# Patient Record
Sex: Female | Born: 1960 | Race: Black or African American | Hispanic: No | Marital: Single
Health system: Southern US, Academic
[De-identification: ages and names within clinical notes are randomized; demographics above are authoritative.]

## PROBLEM LIST (undated history)

## (undated) ENCOUNTER — Telehealth

## (undated) ENCOUNTER — Encounter

## (undated) ENCOUNTER — Ambulatory Visit

## (undated) ENCOUNTER — Encounter
Attending: Pharmacist Clinician (PhC)/ Clinical Pharmacy Specialist | Primary: Pharmacist Clinician (PhC)/ Clinical Pharmacy Specialist

## (undated) ENCOUNTER — Ambulatory Visit: Payer: PRIVATE HEALTH INSURANCE

## (undated) ENCOUNTER — Ambulatory Visit: Payer: PRIVATE HEALTH INSURANCE | Attending: Gastroenterology | Primary: Gastroenterology

## (undated) ENCOUNTER — Encounter: Attending: Gastroenterology | Primary: Gastroenterology

## (undated) ENCOUNTER — Ambulatory Visit
Attending: Pharmacist Clinician (PhC)/ Clinical Pharmacy Specialist | Primary: Pharmacist Clinician (PhC)/ Clinical Pharmacy Specialist

## (undated) ENCOUNTER — Ambulatory Visit: Attending: Diagnostic Radiology | Primary: Diagnostic Radiology

## (undated) ENCOUNTER — Encounter: Payer: PRIVATE HEALTH INSURANCE | Attending: Gastroenterology | Primary: Gastroenterology

## (undated) DIAGNOSIS — G709 Myoneural disorder, unspecified: Secondary | ICD-10-CM

## (undated) DIAGNOSIS — B1920 Unspecified viral hepatitis C without hepatic coma: Secondary | ICD-10-CM

## (undated) HISTORY — DX: Myoneural disorder, unspecified: G70.9

## (undated) HISTORY — DX: Unspecified viral hepatitis C without hepatic coma: B19.20

## (undated) HISTORY — PX: ABDOMINAL HYSTERECTOMY: SHX81

---

## 2008-07-09 ENCOUNTER — Encounter: Admission: RE | Admit: 2008-07-09 | Discharge: 2008-07-09 | Payer: Self-pay | Admitting: Family Medicine

## 2008-11-27 ENCOUNTER — Ambulatory Visit: Payer: Self-pay | Admitting: Gastroenterology

## 2011-02-24 ENCOUNTER — Ambulatory Visit (INDEPENDENT_AMBULATORY_CARE_PROVIDER_SITE_OTHER): Payer: Medicaid Other | Admitting: Gastroenterology

## 2011-02-24 ENCOUNTER — Other Ambulatory Visit: Payer: Self-pay | Admitting: Gastroenterology

## 2011-02-24 VITALS — BP 133/88 | HR 81 | Temp 98.9°F | Ht 71.0 in | Wt 212.0 lb

## 2011-02-24 DIAGNOSIS — B182 Chronic viral hepatitis C: Secondary | ICD-10-CM

## 2011-02-24 LAB — CBC WITH DIFFERENTIAL/PLATELET
Basophils Absolute: 0 10*3/uL (ref 0.0–0.1)
Eosinophils Absolute: 0 10*3/uL (ref 0.0–0.7)
Hemoglobin: 14.7 g/dL (ref 12.0–15.0)
Lymphs Abs: 1.3 10*3/uL (ref 0.7–4.0)
MCHC: 33 g/dL (ref 30.0–36.0)
Monocytes Absolute: 0.4 10*3/uL (ref 0.1–1.0)
Neutrophils Relative %: 51 % (ref 43–77)
Platelets: 210 10*3/uL (ref 150–400)
RBC: 5.7 MIL/uL — ABNORMAL HIGH (ref 3.87–5.11)
RDW: 13.8 % (ref 11.5–15.5)

## 2011-02-24 LAB — TSH: TSH: 0.52 u[IU]/mL (ref 0.350–4.500)

## 2011-02-24 LAB — COMPREHENSIVE METABOLIC PANEL
AST: 65 U/L — ABNORMAL HIGH (ref 0–37)
Chloride: 104 mEq/L (ref 96–112)
Total Bilirubin: 0.9 mg/dL (ref 0.3–1.2)

## 2011-02-24 LAB — PROTIME-INR: INR: 0.93 (ref <1.50)

## 2011-02-24 LAB — IRON AND TIBC
%SAT: 36 % (ref 20–55)
Iron: 133 ug/dL (ref 42–145)
TIBC: 372 ug/dL (ref 250–470)

## 2011-02-25 LAB — HEPATITIS A ANTIBODY, TOTAL: Hep A Total Ab: NEGATIVE

## 2011-03-03 NOTE — Progress Notes (Signed)
NAME:  Kathleen Carter, Kathleen Carter    MR#:  [id number]      DATE:  02/24/2011  DOB:  [birth year]    cc: Consulting physician:  Rogers Seeds, MD, [facility], [address], [place], [place], [address], Fax [phone] Primary care physician  None Referring Physician:  Jackie Plum, MD, [facility], [address], [address], [place], [place] [address], Fax [phone]    REASON FOR VISIT:  Followup genotype 1a HCV previously treated.    History:  The patient returns today unaccompanied. It will be recalled that she is a 50 year old woman who is somewhat of a poor historian but from what I gathered from her last appointment on 11/27/2008, she is  genotype 1a hepatitis C. Her IL-28B was not determined. A biopsy on 10/03/2002, at [facility] in [place] showed minimal chronic inflammatory activity and "essentially no  abnormal fibrosis." She was then started on a combination of Pegasys at 180 mcg subcu weekly, and ribavirin 600 mg p.o. b.i.d. sometime between 2004 and 2007. It appears she was treated for 2 months,  whereupon her treatment was stopped possibly because of thrombocytopenia. Her virological response in the 2 months is unknown. When seen by me on 11/27/2008, I suggested another liver biopsy having  learned that she is genotype 1a, thereafter she was to return to review the results of the liver biopsy and discuss treatment. An order for liver biopsy was sent, but she never followed through. There are  no symptoms directly referable to her history of hepatitis C and there are no symptoms to suggest decompensated or cryoglobulin mediated disease.   Past medical history:  Of note, the patient reports that she no longer sees Dr. Julio Sicks as her primary physician. She is relying on urgent care for various complaints as they arise. She complains of a "bumps" on her skin for  which she has been given topical steroids. She  also complains of nausea for which she is given promethazine by other doctors. She has a history of scoliosis of her spine by self report leading to a  degenerative arthritis. She complains today of significant problems with back pain. She has been able to obtain some analgesic from urgent care physicians.   CURRENT MEDICATIONS:  Promethazine 12.5 mg p.o. p.r.n., Vicodin p.r.n. when she is able to obtain any, Soma 350 mg p.o. q.i.d., clobetasol topically 0.05% b.i.d., clonazepam 0.5 mg p.o. b.i.d.,    Allergies:  Denies.    habits:  Smoking, half pack of cigarettes a day. Alcohol, reports she occasionally drinks. When asked to be more specific, she admitted  having 3 shots of vodka yesterday on 02/23/2011. Her previous alcohol consumption was a glass of wine a month before.    REVIEW OF SYSTEMS:  All 10 systems reviewed today with the patient and they are negative other which is mentioned above. CES-D was 38.   PHYSICAL EXAMINATION:  Constitutional: Appeared stated age without significant bitemporal wasting. Vital signs: Height 71 inches, weight 212 pounds down from 220 on 11/27/2008, blood pressure 133/88, pulse 81, temperature 98.1  Fahrenheit.  Ears, nose, mouth and throat:  Unremarkable oropharynx.  No thyromegaly or neck masses.  Chest:  Resonant to percussion.  Clear to auscultation.  Cardiovascular:  Heart sounds normal S1, S2 without  murmurs or rubs.  There is no peripheral edema.  Abdominal:  Normal bowel sounds.  No masses or tenderness.  I could not appreciate a liver edge or spleen tip.  I could not appreciate any hernias.   Lymphatics:  No cervical or inguinal lymphadenopathy.  Central Nervous System:  No asterixis or focal neurologic findings.  Dermatologic:  Anicteric without palmar erythema or spider angiomata.  Eyes:   Anicteric sclerae.  Pupils are equal and reactive to light.  There are a few raised areas over her back right near her shoulders, which were  neither  erythematous nor lichenified.  They do not appear to have any significant pathologic appearance at all.   Laboratories:  There is no relevant lab work for last 2 years.  From 12/03/2008, her genotype was 1a. Her hepatitis A was never done properly and her total B core antibody and hepatitis B surface antibody were positive.   ASSESSMENT:  The patient is a 50 year old woman with a history of genotype 1a hepatitis C with a biopsy on 10/03/2002, showing minimal chronic inflammation and "essentially no abnormal fibrosis." She had  experienced 2 months of therapy but had treatment discontinued because of thrombocytopenia. I have no records as to exactly what transpired. The patient failed to followthrough 2 years ago with my request for  liver biopsy. At this point, I think she remains a poor candidate for treatment, but is interested in being treated. I think it would be reasonable to have her assessed by our in-house hepatitis C  psychologist before proceeding with a biopsy.  Today, I discussed the results of previous lab testing with her. I discussed the updated therapies for hepatitis C including protease inhibitors, which were not available at the time she was last seen. I  discussed the specific system, constitutional, and psychiatric side effects of therapy with the emphasis on psychiatric side effects. We discussed the importance of compliance because she failed to follow up  in the past. I told her I would not want a biopsy until I had the opinion of our in-house psychologist as to the stability of her  depression, and her fitness to be treated. The patient is very much in agreement with seeing the psychologist before proceeding any further.   plan:  1. I have asked my office to arrange for an appointment with Dr. Sander Radon regarding her fitness to be treated. 2. I will not do any IL-28B testing or liver biopsy until I hear back from Dr. Sander Radon. 3. I will test for hepatitis A today as it was not  done previously. 4. She is hepatitis B immune. 5. Standard labs. 6. Follow up will be determined after she sees Dr. Sander Radon.            Brooke Dare, MD   ADDENDUM: Total HepA negative.  Will need vaccination.  Cryo's not done as ordered.  403 .[id number]  D:  Thu Jul 05 18:26:00 2012 ; T:  Thu Jul 05 20:51:55 2012  Job #:  [id number]

## 2011-03-18 ENCOUNTER — Ambulatory Visit: Payer: Self-pay | Admitting: Physical Medicine & Rehabilitation

## 2011-04-08 ENCOUNTER — Encounter: Payer: Medicaid Other | Attending: Physical Medicine & Rehabilitation

## 2011-04-08 ENCOUNTER — Ambulatory Visit: Payer: Self-pay | Admitting: Physical Medicine & Rehabilitation

## 2011-04-08 DIAGNOSIS — M25519 Pain in unspecified shoulder: Secondary | ICD-10-CM | POA: Insufficient documentation

## 2011-04-08 DIAGNOSIS — R110 Nausea: Secondary | ICD-10-CM | POA: Insufficient documentation

## 2011-04-08 DIAGNOSIS — K5900 Constipation, unspecified: Secondary | ICD-10-CM | POA: Insufficient documentation

## 2011-04-08 DIAGNOSIS — M79609 Pain in unspecified limb: Secondary | ICD-10-CM | POA: Insufficient documentation

## 2011-04-08 DIAGNOSIS — F341 Dysthymic disorder: Secondary | ICD-10-CM | POA: Insufficient documentation

## 2011-04-08 DIAGNOSIS — M5380 Other specified dorsopathies, site unspecified: Secondary | ICD-10-CM | POA: Insufficient documentation

## 2011-04-08 DIAGNOSIS — M542 Cervicalgia: Secondary | ICD-10-CM | POA: Insufficient documentation

## 2011-04-19 ENCOUNTER — Ambulatory Visit (HOSPITAL_BASED_OUTPATIENT_CLINIC_OR_DEPARTMENT_OTHER): Payer: Medicaid Other | Admitting: Physical Medicine & Rehabilitation

## 2011-04-19 DIAGNOSIS — M542 Cervicalgia: Secondary | ICD-10-CM

## 2011-04-19 DIAGNOSIS — M5382 Other specified dorsopathies, cervical region: Secondary | ICD-10-CM
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
## 2011-04-27 ENCOUNTER — Ambulatory Visit: Payer: Medicaid Other | Admitting: Physical Therapy

## 2011-05-16 ENCOUNTER — Other Ambulatory Visit: Payer: Self-pay | Admitting: Physical Medicine & Rehabilitation

## 2011-05-16 ENCOUNTER — Ambulatory Visit (HOSPITAL_BASED_OUTPATIENT_CLINIC_OR_DEPARTMENT_OTHER)
Admission: RE | Admit: 2011-05-16 | Discharge: 2011-05-16 | Disposition: A | Discharge status: Home / Self Care | Payer: Medicaid Other | Source: Ambulatory Visit | Attending: Physical Medicine & Rehabilitation | Admitting: Physical Medicine & Rehabilitation

## 2011-05-16 ENCOUNTER — Ambulatory Visit: Payer: Medicaid Other | Attending: Physical Medicine & Rehabilitation | Admitting: Physical Therapy

## 2011-05-16 DIAGNOSIS — M542 Cervicalgia: Secondary | ICD-10-CM | POA: Insufficient documentation

## 2011-05-16 DIAGNOSIS — IMO0001 Reserved for inherently not codable concepts without codable children: Secondary | ICD-10-CM | POA: Insufficient documentation

## 2011-05-16 DIAGNOSIS — M25519 Pain in unspecified shoulder: Secondary | ICD-10-CM

## 2011-05-16 DIAGNOSIS — R293 Abnormal posture: Secondary | ICD-10-CM | POA: Insufficient documentation

## 2011-05-16 DIAGNOSIS — M5030 Other cervical disc degeneration, unspecified cervical region: Secondary | ICD-10-CM | POA: Insufficient documentation

## 2011-05-17 ENCOUNTER — Ambulatory Visit (HOSPITAL_BASED_OUTPATIENT_CLINIC_OR_DEPARTMENT_OTHER): Payer: Medicaid Other | Admitting: Physical Medicine & Rehabilitation

## 2011-05-17 ENCOUNTER — Encounter: Payer: Medicaid Other | Attending: Physical Medicine & Rehabilitation

## 2011-05-17 DIAGNOSIS — M67919 Unspecified disorder of synovium and tendon, unspecified shoulder: Secondary | ICD-10-CM

## 2011-05-17 DIAGNOSIS — R110 Nausea: Secondary | ICD-10-CM | POA: Insufficient documentation

## 2011-05-17 DIAGNOSIS — M25519 Pain in unspecified shoulder: Secondary | ICD-10-CM | POA: Insufficient documentation

## 2011-05-17 DIAGNOSIS — K5900 Constipation, unspecified: Secondary | ICD-10-CM | POA: Insufficient documentation

## 2011-05-17 DIAGNOSIS — M542 Cervicalgia: Secondary | ICD-10-CM

## 2011-05-17 DIAGNOSIS — M79609 Pain in unspecified limb: Secondary | ICD-10-CM | POA: Insufficient documentation

## 2011-05-17 DIAGNOSIS — F341 Dysthymic disorder: Secondary | ICD-10-CM | POA: Insufficient documentation

## 2011-05-17 DIAGNOSIS — M719 Bursopathy, unspecified: Secondary | ICD-10-CM

## 2011-05-17 DIAGNOSIS — M5380 Other specified dorsopathies, site unspecified: Secondary | ICD-10-CM | POA: Insufficient documentation

## 2011-05-17 DIAGNOSIS — M47812 Spondylosis without myelopathy or radiculopathy, cervical region: Secondary | ICD-10-CM

## 2011-05-17 NOTE — Assessment & Plan Note (Signed)
REASON FOR VISITS:  Neck pain, left upper extremity pain.  A 50 year old female 3-4 weeks history gradually worsening neck pain, left shoulder pain and arm pain.  She has had no significant trauma. She was seen by me initially on April 19, 2011.  We checked 5 views of the cervical spine.  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  She has had no bowel or bladder dysfunction.  She has had no falls or gait disturbance.  She does have left shoulder pain as well as pain going into the arm. She has some burning pain as well.  REVIEW OF SYSTEMS:  Positive for spasms, nausea, constipation and abdominal pain.  She has had a skin rash reappear she has had this in the past.  PAST MEDICAL HISTORY:  Significant for hepatitis C.  HABITS:  Smokes half pack per day and drinks 4 alcoholic beverages per month single, lives alone.  FAMILY HISTORY:  Hypertension and heart disease.  PHYSICAL EXAMINATION:  Alert and oriented x3.  Mood and affect are appropriate.  Neck has decreased range of motion 75% range, forward flexion, extension, lateral rotation and bending.  Negative Spurling sign, some tenderness along the upper trap.  She has some pain with shoulder internal and external rotation, but negative impingement sign. She has normal deep tendon reflex in the upper and lower extremity. Sensation reduced in the left index in the fifth digit compared to the right side.  Her gait is normal.  IMPRESSION: 1. Left-sided back parascapular pain.  Some of this may be myofascial,     but also has cervical spondylosis which will likely account for her     pain as well. 2. Left upper extremity pain.  She appears to have a combination     between cervical radicular pain as well as shoulder joint pain.     Difficult to say how much of her pain is attributed to each of     these etiologies.  I will check MRI of the cervical spine and refer     her to Dr. Ollen Bowl to do a  cervical epidural.  In addition, I will     check an ultrasound of the left shoulder here in the office to se     if she has any rotator cuff problems.  Discussed with the patient,     agrees with plan.  Previous shoulder x-rays were reportedly     unremarkable.  We will continue her Robaxin.  No Soma as requested     because of her hepatitis C.     Erick Colace, M.D. Electronically Signed    AEK/MedQ D:  05/17/2011 09:46:42  T:  05/17/2011 10:22:44  Job #:  [id number]  cc:   Colon Flattery. Ollen Bowl, M.D. Fax: [phone]  Jackie Plum, M.D. Fax: [phone]

## 2011-06-02 ENCOUNTER — Ambulatory Visit (HOSPITAL_BASED_OUTPATIENT_CLINIC_OR_DEPARTMENT_OTHER): Payer: Medicaid Other | Admitting: Physical Medicine & Rehabilitation

## 2011-06-02 ENCOUNTER — Encounter: Payer: Medicaid Other | Attending: Physical Medicine & Rehabilitation

## 2011-06-02 DIAGNOSIS — F341 Dysthymic disorder: Secondary | ICD-10-CM | POA: Insufficient documentation

## 2011-06-02 DIAGNOSIS — M67919 Unspecified disorder of synovium and tendon, unspecified shoulder: Secondary | ICD-10-CM

## 2011-06-02 DIAGNOSIS — M79609 Pain in unspecified limb: Secondary | ICD-10-CM | POA: Insufficient documentation

## 2011-06-02 DIAGNOSIS — M25519 Pain in unspecified shoulder: Secondary | ICD-10-CM | POA: Insufficient documentation

## 2011-06-02 DIAGNOSIS — M542 Cervicalgia: Secondary | ICD-10-CM | POA: Insufficient documentation

## 2011-06-02 DIAGNOSIS — M5380 Other specified dorsopathies, site unspecified: Secondary | ICD-10-CM | POA: Insufficient documentation

## 2011-06-02 DIAGNOSIS — M719 Bursopathy, unspecified: Secondary | ICD-10-CM

## 2011-06-02 DIAGNOSIS — R110 Nausea: Secondary | ICD-10-CM | POA: Insufficient documentation

## 2011-06-02 DIAGNOSIS — K5900 Constipation, unspecified: Secondary | ICD-10-CM | POA: Insufficient documentation

## 2011-06-02 NOTE — Procedures (Signed)
Kathleen Carter, Kathleen Carter             ACCOUNT NO.:  [id number]  MEDICAL RECORD NO.:  [id number]           PATIENT TYPE:  O  LOCATION:  [facility]                          FACILITY:  [facility]  PHYSICIAN:  Erick Colace, M.D.DATE OF BIRTH:  [birth year]  DATE OF PROCEDURE: DATE OF DISCHARGE:                              OPERATIVE REPORT  PROCEDURE:  Left shoulder ultrasound.  INDICATION:  Left shoulder pain as well as neck pain.  She has pain with abduction.  Pain is only partially responsive to medications.  X-rays of the shoulder are negative.  Left bicipital groove examined.  No evidence of subluxation.  Bicipital tendon cross-section is 0.42-cm slightly elevated long-axis views demonstrated no evidence are there was small amount of fluid around the deep aspect of the tendon sheath.  Left supraspinatus.  Long axis views demonstrated no evidence of tear.  Short axis views demonstrated borderline low supraspinatus tendon thickness at 4.3.  There was evidence of subacromial bursal fluid.  Left AC joint examined in long axis views.  Joint capsule intact. Some bony irregularity on the acromial side.  Left infraspinatus tendon examined under short axis long axis views.  No evidence of cortical irregularity.  No evidence of tear. Left supraspinatus tendon examined in abduction and abduction. Dynamically, no evidence of bunching.  Good excursion underneath the acromion.  IMPRESSION: 1. Abnormal study. 2. Evidence of left subacromial bursitis. 3. Mild tenosynovitis, left biceps tendon. 4. No evidence of rotator cuff tear.     Erick Colace, M.D. Electronically Signed    AEK/MEDQ  D:  06/02/2011 13:32:52  T:  06/02/2011 13:56:19  Job:  [id number]

## 2011-06-20 ENCOUNTER — Ambulatory Visit: Payer: Medicaid Other | Admitting: Physical Medicine & Rehabilitation

## 2011-06-30 ENCOUNTER — Ambulatory Visit (INDEPENDENT_AMBULATORY_CARE_PROVIDER_SITE_OTHER): Payer: Medicaid Other | Admitting: Gastroenterology

## 2011-06-30 ENCOUNTER — Other Ambulatory Visit: Payer: Self-pay | Admitting: Gastroenterology

## 2011-06-30 DIAGNOSIS — B182 Chronic viral hepatitis C: Secondary | ICD-10-CM

## 2011-07-01 ENCOUNTER — Ambulatory Visit (HOSPITAL_BASED_OUTPATIENT_CLINIC_OR_DEPARTMENT_OTHER): Payer: Medicaid Other | Admitting: Physical Medicine & Rehabilitation

## 2011-07-01 ENCOUNTER — Encounter: Payer: Medicaid Other | Attending: Physical Medicine & Rehabilitation

## 2011-07-01 DIAGNOSIS — M542 Cervicalgia: Secondary | ICD-10-CM | POA: Insufficient documentation

## 2011-07-01 DIAGNOSIS — M5380 Other specified dorsopathies, site unspecified: Secondary | ICD-10-CM | POA: Insufficient documentation

## 2011-07-01 DIAGNOSIS — M79609 Pain in unspecified limb: Secondary | ICD-10-CM | POA: Insufficient documentation

## 2011-07-01 DIAGNOSIS — M25519 Pain in unspecified shoulder: Secondary | ICD-10-CM | POA: Insufficient documentation

## 2011-07-01 DIAGNOSIS — F341 Dysthymic disorder: Secondary | ICD-10-CM | POA: Insufficient documentation

## 2011-07-01 DIAGNOSIS — R110 Nausea: Secondary | ICD-10-CM | POA: Insufficient documentation

## 2011-07-01 DIAGNOSIS — K5900 Constipation, unspecified: Secondary | ICD-10-CM | POA: Insufficient documentation

## 2011-07-01 LAB — CBC WITH DIFFERENTIAL/PLATELET
Eosinophils Absolute: 0 10*3/uL (ref 0.0–0.7)
Eosinophils Relative: 1 % (ref 0–5)
HCT: 44.8 % (ref 36.0–46.0)
Hemoglobin: 15 g/dL (ref 12.0–15.0)
Lymphs Abs: 1.6 10*3/uL (ref 0.7–4.0)
MCH: 26.2 pg (ref 26.0–34.0)
MCV: 78.2 fL (ref 78.0–100.0)
Monocytes Relative: 10 % (ref 3–12)
Neutro Abs: 1.4 10*3/uL — ABNORMAL LOW (ref 1.7–7.7)
Neutrophils Relative %: 41 % — ABNORMAL LOW (ref 43–77)
RBC: 5.73 MIL/uL — ABNORMAL HIGH (ref 3.87–5.11)
RDW: 14 % (ref 11.5–15.5)

## 2011-07-01 LAB — HEPATIC FUNCTION PANEL
AST: 65 U/L — ABNORMAL HIGH (ref 0–37)
Albumin: 4.8 g/dL (ref 3.5–5.2)
Bilirubin, Direct: 0.4 mg/dL — ABNORMAL HIGH (ref 0.0–0.3)
Indirect Bilirubin: 0.7 mg/dL (ref 0.0–0.9)

## 2011-07-01 LAB — PROTIME-INR: Prothrombin Time: 12.5 seconds (ref 11.6–15.2)

## 2011-07-01 NOTE — Procedures (Signed)
Kathleen Carter, Kathleen Carter             ACCOUNT NO.:  [id number]  MEDICAL RECORD NO.:  [id number]           PATIENT TYPE:  O  LOCATION:  [facility]                          FACILITY:  [facility]  PHYSICIAN:  Erick Colace, M.D.DATE OF BIRTH:  06-Aug-1961  DATE OF PROCEDURE: DATE OF DISCHARGE:                              OPERATIVE REPORT  PROCEDURE:  Left subacromial bursa injection under ultrasound guidance.  INDICATION:  Subacromial bursitis.  She has increased fluid within the subacromial space.  The pain is only partially responsive to medication management and other conservative care.  DESCRIPTION:  Informed consent was obtained after describing risks and benefits of the procedure with the patient.  These include bleeding, bruising, and infection.  She elects to proceed and has given a written consent.  The patient was placed in a seated position.  Area pre-scanned and marked and prepped with Betadine alcohol.  Using a sterile probe cover and sterile technique, a 25-gauge inch and half needle was first used to anesthetize the skin and subcu tissue 1% lidocaine x2 mL, then a 22-gauge 40-mm EchoBlock needle was inserted under fluoroscopic guidance starting at the left subacromial bursa.  The needle was placed without difficulty into the bursa and 3 mL of solution containing 40 mg/mL Depo- Medrol and 3 mL of 1% MPF lidocaine were injected.  The patient tolerated the procedure well.  Postprocedure instructions were given. Follow up in 1 month [facility].     Erick Colace, M.D. Electronically Signed    AEK/MEDQ  D:  07/01/2011 11:54:53  T:  07/01/2011 12:31:07  Job:  [id number]

## 2011-07-03 LAB — INTERLEUKIN 28B POLYMORPHISM GENOTYPE, RT-PCR

## 2011-07-07 NOTE — Patient Instructions (Signed)
NAME:  Carter, Kathleen    MR#:  [id number]      DATE:  06/30/2011  DOB:  07/15/1961    cc: Consulting Physician:  Kathleen Patel, MD, [facility], [address], [place] [address], Fax [phone] Primary Care Physician:  None. Referring Physician:  George Osei-Bonesu, MD, [facility], [address], [address], [place], [place] [address], Fax [phone]    REASON FOR VISIT:  Followup genotype 1a hepatitis C previously treated.    History:  The patient returns today unaccompanied. Since last being seen on 02/24/2011, there been no interval events related to her liver disease. There are no symptoms to suggest cryoglobulin mediated or  decompensated liver disease. For some reason there was no appointment arranged to see Dr. Evon our psychologist at [facility]. There is no medical record number for her at [facility] suggesting that the records were never  sent. My plan was to have her seen by Dr. Evon first and then proceed with liver biopsy thereafter, if she received a favorable review for her fitness for treatment.   PAST MEDICAL HISTORY:  No interval change. She has still not found a primary care physician. She asked me to fill a prescription for promethazine that she receives p.r.n. for nausea. She also is interested in smoking cessation.   CURRENT MEDICATIONS:  Methocarbamol 500 mg p.o. t.i.d., estradiol 1 mg daily, Phenergan 12.5 mg p.o. p.r.n.   ALLERGIES:  Denies.   HABITS:  Smoking half pack of cigarettes per day. As mentioned above, she would like to stop smoking. Alcohol denies. She occasionally drinks but has previously given vague history as to the amount of alcohol she drinks.   REVIEW OF SYSTEMS:  All 10 systems reviewed today with the patient and they are negative other than which was mentioned above. CES-D was 24.   PHYSICAL EXAMINATION:   Constitutional:  Appeared stated age without significant bitemporal wasting. Vital signs: Height 71  inches, weight 217 pounds up 5 pounds from previously. Blood pressure 120/80, pulse 76, temperature 98.2.   Laboratories:  From 02/24/2011; her CBC was unremarkable.  Her ALT was 70, total bilirubin 0.9, albumin 4.6, and creatinine was 0.73. TSH was normal. Total A antibody was negative. AFP was 12.7.   Assessment:  The patient is a 50-year-old woman with a history of genotype 1a hepatitis C with a previous liver biopsy on 10/03/2002 at [facility], [place], that showed diffuse steatosis  with minimal chronic inflammatory activity and "essentially no abnormal fibrosis." She was treated with pegylated interferon and ribavirin for at least 2 months in and around 2004 with treatment  stopping on 05/2013 2004. Unfortunately, I did not receive sufficient documentation to tell when she started on therapy, exactly how long she was on therapy, and what her response was. The patient claimed  that the treatment was stopped because of thrombocytopenia.  As to her candidacy for treatment, she is interferon experienced, but I do not have enough information to declare her a null or partial nonresponder. I think she would be a reasonable candidate for  treatment in that I think to stay in that I think it would be worth biopsying to re-stage her disease. I also think she should see Dr. Evon or her associates to screen her for mental health  contraindications to treatment. Given the delays in setting this up, I think we will proceed with liver biopsy now, as well as checking her IL 28 b genotype being as a   prelude to treatment.  In my discussion today with the patient, we discussed the evaluation with the psychologist and the biopsy. The patient very much in favor of proceeding with the use of the testing with the testing to see if  she is candidate for treatment. I will discuss the possibility that if her disease shows minimal fibrosis compared to the biopsy, which would be minimal  progression since the biopsy in 2004, we may elect to hold  off on therapy until the availability of treatment with less toxicity or at least easier dosing intervals. She was very much in agreement with this, as well.   PLAN:  1. Will test her IL 28 B today. 2. CBC, INR, and liver enzymes. 3. Arrange for liver biopsy. 4. Will take her records myself to [facility] to arrange for assessment with Dr. Evon. 5. Follow up will be dependent on the results of the biopsy and the evaluation with Dr. Evon. 6. She received first hepatitis A vaccine today. 7. Hepatitis B immune.            Kathleen Gloeckner, MD   ADDENDUM  IL28 B CT.  [phone]  D:  Thu Nov 08 17:30:56 2012 ; T:  Thu Nov 08 18:46:45 2012  Job #:  [id number]  

## 2011-07-07 NOTE — Progress Notes (Signed)
NAMELYNNELL, FIUMARA    MR#:  [id number]      DATE:  06/30/2011  DOB:  [birth year]    cc: Consulting Physician:  Coralyn Pear, MD, [facility], [address], [place], [place] [address], Fax [phone] Primary Care Physician:  None. Referring Physician:  Thomes Cake, MD, [facility], [address], [address], [place], [place] [address], Fax [phone]    REASON FOR VISIT:  Followup genotype 1a hepatitis C previously treated.    History:  The patient returns today unaccompanied. Since last being seen on 02/24/2011, there been no interval events related to her liver disease. There are no symptoms to suggest cryoglobulin mediated or  decompensated liver disease. For some reason there was no appointment arranged to see Dr. Sander Radon our psychologist at [facility]. There is no medical record number for her at [facility] suggesting that the records were never  sent. My plan was to have her seen by Dr. Sander Radon first and then proceed with liver biopsy thereafter, if she received a favorable review for her fitness for treatment.   PAST MEDICAL HISTORY:  No interval change. She has still not found a primary care physician. She asked me to fill a prescription for promethazine that she receives p.r.n. for nausea. She also is interested in smoking cessation.   CURRENT MEDICATIONS:  Methocarbamol 500 mg p.o. t.i.d., estradiol 1 mg daily, Phenergan 12.5 mg p.o. p.r.n.   ALLERGIES:  Denies.   HABITS:  Smoking half pack of cigarettes per day. As mentioned above, she would like to stop smoking. Alcohol denies. She occasionally drinks but has previously given vague history as to the amount of alcohol she drinks.   REVIEW OF SYSTEMS:  All 10 systems reviewed today with the patient and they are negative other than which was mentioned above. CES-D was 24.   PHYSICAL EXAMINATION:   Constitutional:  Appeared stated age without significant bitemporal wasting. Vital signs: Height 71  inches, weight 217 pounds up 5 pounds from previously. Blood pressure 120/80, pulse 76, temperature 98.2.   Laboratories:  From 02/24/2011; her CBC was unremarkable.  Her ALT was 70, total bilirubin 0.9, albumin 4.6, and creatinine was 0.73. TSH was normal. Total A antibody was negative. AFP was 12.7.   Assessment:  The patient is a 50 year old woman with a history of genotype 1a hepatitis C with a previous liver biopsy on 10/03/2002 at [facility], [place], that showed diffuse steatosis  with minimal chronic inflammatory activity and "essentially no abnormal fibrosis." She was treated with pegylated interferon and ribavirin for at least 2 months in and around 2004 with treatment  stopping on 05/2013 2004. Unfortunately, I did not receive sufficient documentation to tell when she started on therapy, exactly how long she was on therapy, and what her response was. The patient claimed  that the treatment was stopped because of thrombocytopenia.  As to her candidacy for treatment, she is interferon experienced, but I do not have enough information to declare her a null or partial nonresponder. I think she would be a reasonable candidate for  treatment in that I think to stay in that I think it would be worth biopsying to re-stage her disease. I also think she should see Dr. Sander Radon or her associates to screen her for mental health  contraindications to treatment. Given the delays in setting this up, I think we will proceed with liver biopsy now, as well as checking her IL 28 b genotype being as a  prelude to treatment.  In my discussion today with the patient, we discussed the evaluation with the psychologist and the biopsy. The patient very much in favor of proceeding with the use of the testing with the testing to see if  she is candidate for treatment. I will discuss the possibility that if her disease shows minimal fibrosis compared to the biopsy, which would be minimal  progression since the biopsy in 2004, we may elect to hold  off on therapy until the availability of treatment with less toxicity or at least easier dosing intervals. She was very much in agreement with this, as well.   PLAN:  1. Will test her IL 28 B today. 2. CBC, INR, and liver enzymes. 3. Arrange for liver biopsy. 4. Will take her records myself to [facility] to arrange for assessment with Dr. Sander Radon. 5. Follow up will be dependent on the results of the biopsy and the evaluation with Dr. Sander Radon. 6. She received first hepatitis A vaccine today. 7. Hepatitis B immune.            Brooke Dare, MD   ADDENDUM  IL28 B CT.  [phone]  D:  Thu Nov 08 17:30:56 2012 ; T:  Thu Nov 08 18:46:45 2012  Job #:  [id number]

## 2011-07-25 ENCOUNTER — Other Ambulatory Visit (HOSPITAL_COMMUNITY): Payer: Self-pay | Admitting: Radiology

## 2011-07-25 ENCOUNTER — Encounter: Payer: Self-pay | Admitting: Gastroenterology

## 2011-07-26 ENCOUNTER — Ambulatory Visit (HOSPITAL_COMMUNITY)
Admission: RE | Admit: 2011-07-26 | Discharge: 2011-07-26 | Disposition: A | Discharge status: Home / Self Care | Payer: Medicaid Other | Source: Ambulatory Visit | Attending: Gastroenterology | Admitting: Gastroenterology

## 2011-07-26 ENCOUNTER — Other Ambulatory Visit: Payer: Self-pay | Admitting: Gastroenterology

## 2011-07-26 DIAGNOSIS — B182 Chronic viral hepatitis C: Secondary | ICD-10-CM

## 2011-07-26 DIAGNOSIS — R110 Nausea: Secondary | ICD-10-CM

## 2011-07-26 DIAGNOSIS — M129 Arthropathy, unspecified: Secondary | ICD-10-CM | POA: Insufficient documentation

## 2011-07-26 LAB — APTT: aPTT: 32 seconds (ref 24–37)

## 2011-07-26 LAB — CBC: HCT: 41 % (ref 36.0–46.0)

## 2011-07-26 LAB — PROTIME-INR: INR: 1.05 (ref 0.00–1.49)

## 2011-07-26 MED ORDER — MIDAZOLAM HCL 5 MG/5ML IJ SOLN
INTRAMUSCULAR | Status: AC | PRN
  Administered 2011-07-26 (×2): 2 mg via INTRAVENOUS

## 2011-07-26 MED ORDER — MIDAZOLAM HCL 2 MG/2ML IJ SOLN
INTRAMUSCULAR | Status: AC
  Filled 2011-07-26: qty 4

## 2011-07-26 MED ORDER — HYDROCODONE-ACETAMINOPHEN 5-325 MG PO TABS
1.0000 | ORAL_TABLET | ORAL | Status: DC | PRN
  Filled 2011-07-26: qty 1

## 2011-07-26 MED ORDER — SODIUM CHLORIDE 0.9 % IV SOLN
INTRAVENOUS | Status: DC
  Administered 2011-07-26: 10:00:00 via INTRAVENOUS

## 2011-07-26 MED ORDER — PROMETHAZINE HCL 25 MG PO TABS
25.0000 mg | ORAL_TABLET | Freq: Once | ORAL | Status: AC
  Administered 2011-07-26: 25 mg via ORAL
  Filled 2011-07-26: qty 1

## 2011-07-26 MED ORDER — FENTANYL CITRATE 0.05 MG/ML IJ SOLN
INTRAMUSCULAR | Status: AC
  Filled 2011-07-26: qty 4

## 2011-07-26 MED ORDER — PROMETHAZINE HCL 25 MG PO TABS: 25.0000 mg | ORAL_TABLET | Freq: Three times a day (TID) | ORAL | Status: DC | PRN

## 2011-07-26 MED ORDER — SODIUM CHLORIDE 0.9 % IV SOLN: INTRAVENOUS | Status: DC

## 2011-07-26 MED ORDER — FENTANYL CITRATE 0.05 MG/ML IJ SOLN
INTRAMUSCULAR | Status: AC | PRN
  Administered 2011-07-26 (×4): 50 ug via INTRAVENOUS

## 2011-07-26 MED ORDER — HYDROCODONE-ACETAMINOPHEN 5-325 MG PO TABS
ORAL_TABLET | ORAL | Status: AC
  Administered 2011-07-26: 1 via ORAL
  Filled 2011-07-26: qty 1

## 2011-07-26 NOTE — Procedures (Signed)
US guided random core liver biopsy( right hepatic lobe) performed x2 via 18gauge needle. Medications utilized- versed 4 mg IV, fentanyl 200 mcg IV, 1% lidocaine. No immediate complications. Pathology pending.

## 2011-07-26 NOTE — H&P (Signed)
Kathleen Carter is an 50 y.o. female.   Chief Complaint: "I'm here for a liver biopsy" HPI: Patient with history of hepatitis C presents today for elective US guided random core liver biopsy.  Past Medical History: arthritis; denies HTN,DM, CAD,COPD, CA Past Surgical History: partial hysterectomy Family History: noncont.                       Social History:smoker, denies alcohol use; lives in [place], 5 children, unemployed Allergies:  Allergies  Allergen Reactions  . Tylenol (Acetaminophen)     Current outpatient prescriptions:methocarbamol (ROBAXIN) 500 MG tablet, Take 500 mg by mouth 3 (three) times daily. For pain , Disp: , Rfl: ;  Multiple Vitamins-Minerals (MULTIVITAMINS THER. W/MINERALS) TABS, Take 1 tablet by mouth daily.  , Disp: , Rfl: ;  promethazine (PHENERGAN) 25 MG tablet, Take 25 mg by mouth every 8 (eight) hours as needed. For nausea , Disp: , Rfl:  Current facility-administered medications:0.9 %  sodium chloride infusion, , Intravenous, Continuous, Robet Leu, PA, Last Rate: 20 mL/hr at 07/26/11 0945   Results for orders placed during the hospital encounter of 07/26/11 (from the past 48 hour(s))  APTT     Status: Normal   Collection Time   07/26/11  9:02 AM      Component Value Range Comment   aPTT 32  24 - 37 (seconds)   CBC     Status: Abnormal   Collection Time   07/26/11  9:02 AM      Component Value Range Comment   WBC 3.2 (*) 4.0 - 10.5 (K/uL)    RBC 5.28 (*) 3.87 - 5.11 (MIL/uL)    Hemoglobin 13.5  12.0 - 15.0 (g/dL)    HCT 45.4  09.8 - 11.9 (%)    MCV 77.7 (*) 78.0 - 100.0 (fL)    MCH 25.6 (*) 26.0 - 34.0 (pg)    MCHC 32.9  30.0 - 36.0 (g/dL)    RDW 14.7  82.9 - 56.2 (%)    Platelets 160  150 - 400 (K/uL)   PROTIME-INR     Status: Normal   Collection Time   07/26/11  9:02 AM      Component Value Range Comment   Prothrombin Time 13.9  11.6 - 15.2 (seconds)    INR 1.05  0.00 - 1.49     No results found.  Review of Systems  Constitutional:  Negative for fever and chills.  Respiratory: Negative for cough and shortness of breath.   Cardiovascular: Negative for chest pain.  Gastrointestinal: Positive for nausea. Negative for vomiting and abdominal pain.  Musculoskeletal: Positive for joint pain.  Neurological: Negative for headaches.  Endo/Heme/Allergies: Does not bruise/bleed easily.    Blood pressure 103/65, pulse 71, temperature 97.9 F (36.6 C), temperature source Oral, resp. rate 20, height 5\' 11"  (1.803 m), weight 212 lb (96.163 kg), SpO2 100.00%. Physical Exam  Constitutional: She is oriented to person, place, and time. She appears well-developed and well-nourished.  Cardiovascular: Normal rate and regular rhythm.   Respiratory: Effort normal and breath sounds normal.  GI: Soft. Bowel sounds are normal. She exhibits no distension. There is no tenderness.  Musculoskeletal: Normal range of motion.  Neurological: She is alert and oriented to person, place, and time.     Assessment/Plan Patient with history of hepatitis C; plan is for US guided random core liver biopsy with IV conscious sedation.  Kathleen Carter,D KEVIN 07/26/2011, 9:46 AM

## 2011-07-26 NOTE — ED Notes (Addendum)
Pt. Tearful very anxious about procedure.

## 2011-07-26 NOTE — ED Notes (Signed)
Patient denies pain and is resting comfortably.  

## 2011-07-28 ENCOUNTER — Encounter: Payer: Self-pay | Admitting: Gastroenterology

## 2011-07-28 ENCOUNTER — Encounter: Payer: Medicaid Other | Attending: Neurosurgery | Admitting: Neurosurgery

## 2011-07-28 DIAGNOSIS — M542 Cervicalgia: Secondary | ICD-10-CM

## 2011-07-28 DIAGNOSIS — M25519 Pain in unspecified shoulder: Secondary | ICD-10-CM | POA: Insufficient documentation

## 2011-07-28 DIAGNOSIS — M67919 Unspecified disorder of synovium and tendon, unspecified shoulder: Secondary | ICD-10-CM | POA: Insufficient documentation

## 2011-07-28 DIAGNOSIS — G894 Chronic pain syndrome: Secondary | ICD-10-CM

## 2011-07-28 DIAGNOSIS — M719 Bursopathy, unspecified: Secondary | ICD-10-CM | POA: Insufficient documentation

## 2011-07-28 NOTE — Assessment & Plan Note (Signed)
This is a patient of Dr. Wynn Banker seen for right shoulder pain as well as some back pain.  She underwent an injection with him for the right shoulder last month and did well with that.  She rates her average pain is 7-8.  It is a sharp, burning and aching pain.  General activity level is 7-8.  Pain is worse in the morning.  Sleep patterns are poor. Sitting and standing tend to aggravate.  She does not indicate for medicine helps or not.  Mobility, she climb steps.  She does not drive. She is on disability.  REVIEW OF SYSTEMS:  Notable for the difficulties described above as well as some weight gain, nausea and constipation.  No suicidal thoughts or aberrant behaviors.  Last pill count is correct.  She has not had a UDS. We will obtain that today.  PAST MEDICAL HISTORY, SOCIAL HISTORY AND FAMILY HISTORY:  Unchanged.  PHYSICAL EXAMINATION:  VITAL SIGNS:  Her blood pressure is 119/74, pulse 88, respirations 16 and O2 sats 100 on room air.  EXTREMITIES:  Motor strength and sensation are intact.  She has got good range of motion in the right shoulder now. NEUROLOGIC:  Constitutionally, she is within normal limits.  She is alert and oriented x3.  She has normal gait.  ASSESSMENT: 1. Right shoulder bursitis 2. Cervicalgia.  PLAN:  Refill hydrocodone 7.5/325, 1 p.o. daily p.r.n. 30 with no refill.  Her questions were encouraged and answered.  She will follow up with Dr. Wynn Banker in a month to discuss another injection.     Abbie Berling L. Blima Dessert Electronically Signed    RLW/MedQ D:  07/28/2011 09:47:52  T:  07/28/2011 23:32:03  Job #:  [id number]

## 2011-08-20 NOTE — Progress Notes (Signed)
Patient ID: Kathleen Carter, female   DOB: 09-23-1960, 50 y.o.   MRN: [id number] FINAL REPORT [facility] [place], [place] [address]    Patient Name: Kathleen Carter, Kathleen Carter Medical Record Number [id number] Date of Service 07/25/2011 Attending Psychologist Rae Mar, Ph.D.   Primary Diagnosis: Chronic Hepatitis C (070.54)  Evaluation Duration: 2 hours  CPT Code: 96150 Health and Behavior Assessment Code (physical dx of Hep C is primary)  REFERRING PROVIDERS: Brooke Dare, [facility] Hepatology  CONSULTING PHYSICIAN: Coralyn Pear, MD, [place], [place], [phone] (fax)  CONFIDENTIAL PSYCHOLOGICAL EVALUATION  Ms. Kathleen Carter is a 49 year old single African American female from [place], who has been diagnosed with hepatitis C (HCV). She was seen in consultation today at the request of Dr. Jacqualine Mau  to conduct a health and behavioral assessment to determine her  psychosocial functioning and appropriateness for HCV treatment  at this time.  BEHAVIORAL OBSERVATIONS: The patient arrived on time. She attended the session alone. Her boyfriend Louis provided her with transportation to the hospital, but did not attend the session. The patient declined when offered to have him present for part of the session. During this evaluation, the patient disclosed information about her mental health and substance abuse history that she did not report during past interactions with Dr. Jacqualine Mau. Ms. Lacerda appeared predominantly open and honest in presenting information today. It is possible that she was continuing to somewhat minimize substance abuse and mental health information, but overall, this report is considered an accurate reflection of the patient's current functioning and readiness for IFN treatment.  MENTAL STATUS EXAM:  Appearance: Middle-aged woman casually dressed in jeans and sweatshirt and adequately groomed. Hair somewhat disheveled.  Behavior: Somewhat fidgety, moved around in her seat. Eye  contact was good. Body language communicated guardedness and distrust at some points, and engagement at other points during the evaluation.  Attitude: Patient was mostly engaged in the interview process and cooperative. However, her attitude towards the interviewer was variable. At times she communicated in a distrusting, defensive, and contentious manner and she became upset when she felt misinterpreted. At other times, she issued excessive praise.  Speech: Normal in rate, rhythm and volume.  Mood: "Pretty good." Observed to be mildly irritable and anxious.  Affect: Labile. Smiled and joked at times, but also displayed expressions of anxiety, irritability and misgivings at points.   Thought Process: Linear, logical and goal-directed. Perseverated on the topics of her weight and body image, and anxiety about her liver health and medical procedures.  Thought Content: Denied current SI/HI, intent or plan. No evidence of current abnormal sensory experiences, delusions, or hallucinations.  Insight: Poor-fair insight into emotional functioning.  Judgement: Poor-fair. The patient is adamant that she will not  take psychiatric medications despite her extensive psychiatric history and disability.  HCV HISTORY: Diagnosed: 2002  Genotype: 1a  Il28b: unknown  Liver Disease Status: Unknown. Last biopsy in 2004 "essentially no abnormal fibrosis." Scheduled for new biopsy on 07/26/11.  HCV Treatment Status: experienced  Prior HCV Treatment Experience: Treated with PegIFN/RBV for about 2 months in 2004 in [place]. Patient reported tx was discontinued by her physician due to side effects, including thrombocytopenia. She reported minimal side effects, including rash. She denied side  effects including flu-like symptoms, fatigue, or increased psychiatric symptoms. Dr. Ferd Hibbs note indicates that we do not have sufficient documentation from her prior treatment to further verify details of her  treatment and response.  Possible Mode of Transmission: history of illicit drug use including IVDs  MEDICAL  HISTORY: Bursitis, chronic low back pain, PDS, scoliosis. Cortisone shot in shoulder for bursitis 1 month ago was helpful.  Subjective Health Rating (0:POOR TO 10:EXCELLENT): "8"  Primary Complaints: chronic pain, weight gain  Chronic Pain Rating (0:None to 10:Worst): average = "7"  Medications: Promethazine for nausea; methocarbamol (muscle relaxer); estradiol; Norco for pain. She is taking an OTC "diet pill" that she saw on Dr. Neil Crouch. Advised to disclose this to her physicians.  Medication Adherence: Admitted to sometimes taking more pain medications than indicated for break-through pain. Also takes 2 muscle relaxers for sleep when prescribed for pain.  Medical History Notes: No current PCP, although Dr. Jacqualine Mau has encouraged establishing one. Does not disclose all medication-taking behaviors to her physicians. The importance of disclosing this information to her providers was discussed. The patient emphasized the importance of good communication with her providers. She has some distrust of medical professionals and it is important that she feels she is being listened to and is "in the loop" regarding treatment decisions. Lastly, she has notable anxiety about medical symptoms and treatment procedures. For example, she perseverated about her fears of the upcoming liver biopsy at [facility] during today's session.  LIFESTYLE HABITS:  Nicotine: Smokes a half a ppd. Interested in quitting. Was prescribed buproprion and nicotine patches by Dr. Jacqualine Mau but has not yet used them. Considering a quit date of late Dec. before she visits family members who do not smoke. Her boyfriend quit a few months ago and is supportive of her plans to quit.  Caffeine: 2-3 sodas/day 2 cups coffee/day  Physical activity: walks 15-20 min/day, otherwise inactive.  Nutrition/Appetite: good appetite,  eats small meal at noon and  dinner around 6-7:00, snacks throughout day especially on candy/sweets  Weight Change: has gained about 10 lbs in past month  Sleep: "Poor" including difficulties with sleep initiation and  frequent waking restlessness. Gets average of 4 hours/night, but feels rested.  FAMILY AND SOCIAL FUNCTIONING:  Born/Raised: [place]  Early Family Life: Several siblings, raised by parents until they divorced when she was 5. Father moved to [place]. This was very difficult for the patient. She denied abuse or neglect in the home. Father committed suicide when patient in her late 64's. She described  this incident as quite traumatic and was a trigger for her initiation of illicit drug abuse.  Marital Status: Currently dating Louis for 2 years. Described as very supportive, "best thing I ever had." She was in a relationship with the father of her 5 children for 23 years but did not wish to marry him.  Children: 5 children. 2 in college in [place], 2 in college in  [place], 1 with noted problems "in trouble" in [place]. She is very proud of her children and their accomplishments.  Current Family Life/Environment: Lives alone in an assisted living facility for elderly and disabled reported that she is eligible  to live there based on her low income status. She spends about  10 days/month there, but stays with her boyfriend in his apartment the rest of the time.  Social Support: The patient moved to [place] about 3 years ago with  a boyfriend; the relationship did not work out but she decided to stay. She has limited social support in the local area related to this situation. Her current boyfriend Lissa Hoard was described as very supportive and loving. She has not disclosed her HCV status to  him, stating "it's none of his business." She has disclosed to  her siblings and  children who are supportive. She also has a best friend here in [place], who provides emotional  support.  Transportation: Patient does not have her own means of transportation. She relies on her boyfriend, who she reported will be able to take her to all [facility] medical appointments. However, she did seem surprised when the need to attend appointments 2x/month was discussed, but still felt Louis would be able to do it. He is retired and has a flexible schedule. The patient was not able to identify a "back-up" plan in case Louis was unable to drive.  EDUCATIONAL STATUS: HS degree, no problems with literacy indicated.  OCCUPATIONAL STATUS: On disability since 2010 for mental health ("depression"). Previously worked as a Social worker for 15 years.  SUBSTANCE USE HISTORY:  Illicit Drug Use: Significant for IVDU (heroin) crack cocaine, and marijuana. Used marijuana on a regular basis during her 21s. Used heroin and crack heavily for about 10 years, with last use reported as about 10 years ago. Continues to smoke marijuana on a daily basis, spending about $10/week and smoking one joint over the course of 2-3 days.  Licit Drug Abuse/Misuse: She currently borrows opioid pain medications from others. Frequency was not clear, but reported as "when I'm  hurting, I can get it." The last incident was 2 weeks ago when  she took a Percocet from a friend. She also tends to take more  of her Norco and muscle relaxer than prescribed. She reports not taking extra pills to get high, but to manage pain and sleep problems.  Alcohol Use: Denied significant history of alcohol problems, "I'm not a big drinker." About one year ago, she began having a few  drinks on special occasions. However, her last drink was yesterday when she consumed 2 shots because she "was in a good mood." The last time she drank before this was in October 2012. She intends to consume alcohol on January 2nd while visiting family in [place].  Past Treatment and Status: Has been in substance abuse treatment programs many times in [place]  including inpatient treatment and detox. She was unable to provide specific information. When she completely quit heroin and cocaine 10 years ago, she moved to [facility] to be with her brother and identified this move as the most significant factor in helping her stay clean.  Current Status: The patient continues to use marijuana on a daily basis. Her current alcohol consumption was described as occasional, but she drank last night. She is misusing opioid pain medications although may be related to chronic pain management more than getting high. She is not involved in type of substance abuse treatment  or AA. The patient feels extremely confident that she will not  use heroin or cocaine ever again, "nothing could make me relapse at this point." She does not associate with people who use hard drugs and denied having any lapses since quitting 10 years ago.  Overall, Ms. Mcfall is judged to be at relatively low risk for  relapse to heroin and cocaine at this time. The patient was counseled on the effects of alcohol and marijuana on the progression of liver disease and treatment efficacy. She reported being previously unaware of these implications. She feels that she would be able to abstain completely during treatment. It seems likely that she would be  able to abstain from alcohol. It seems likely that she would reduce, but not quit, marijuana as this has been a more long-standing habit and she feels that it helps to manage  her chronic pain.  Legal History: History of drug possession charges including jail time while living in [place] 10 years ago. She denied history of prison. [place] and [place] Department [employer]rections databases did not indicate  any legal charges under her name.  MENTAL HEALTH HISTORY:  Psychiatric Hospitalizations: The patient has been hospitalized  numerous times during her drug abuse. Was unable to provide specific information about past treatment, but indicated she was usually   treated for depression and/or mania while receiving inpatient SA treatment. She indicated one involuntary hospitalization likely for a manic episode ("going off" and reckless behavior) and being judged at risk.  Suicide Attempts: Denied suicide attempts or active SI. Endorsed passive SI when using drugs and not caring if she lived or died while using.  Suicidal or Homicidal Ideation: Denied even passive SI for over 10 years since she quit using hard drugs. Denied history of HI.  Self-Injurious Behavior: Denied.  Family Mental Health History: Extensive family history of depression, father committed suicide, all siblings with depression.  Psychiatric medication: Patient is not currently under the care of a psychiatrist or taking psychiatric medications. She previously took a number of medications, but was unable to provide specific information about types. She believes she was over-medicated and that this led to side effects including weight gain and "feeling like a zombie." She does not want to take psychiatric medications again due to the belief they will cause these side effects and  do more harm than good. Patient was counseled on the possibility of IFN treatment increasing psychiatric symptoms, and the requirement for management of her Bipolar disorder by a psychiatrist if she should undergo this treatment in the future. However, she is adamant that she does not want to take psychiatric medication.  Counseling or other Psychological Treatments: It does not appear that the patient has ever been involved in longer term outpatient aftercare.   Past Psychiatric diagnoses: Diagnosed with Bipolar disorder in  past but also co-occurred in the context of illicit drug use. However, seems to have longstanding history of psychiatric issues independent of drug use. Psychological problems began at age 24 when parents divorced. She feigned illness to receive medical treatment but   eventually malingering was discovered and she was referred for psychiatric treatment. Mental health problems more pronounced during drug  use. Also described significant history of depressive and manic  episodes, including decreased need for sleep, agitation, and reckless behavior. She also displays tendency for anxiety and worry but  denied diagnosis of anxiety disorder. No indication of psychotic symptoms. Although she experienced numerous traumatic life events during her drug abuse, she denied symptoms of PTSD.  Current Symptoms: Has felt herself "slipping into a depression" lately. Described frequent tearfulness, mood swings, and low self-esteem, especially related to weight gain and body image. Described self as a "worrier" about every little thing and has low patience as evidenced today. She does not like to be alone and becomes anxious and uncomfortable when she is. No overt manic symptoms noted today. The last episode of potential mania was reported 2 months ago when she had an interaction with a neighbor whom she perceived to be "taunting her" and could not sleep at night due to agitation and racing thoughts. She copes by using prayer.  Mood over Past 30 Days (0:None to 10:Worst)  Depression: 3  Anxiety: 5  Irritability: 2  Happiness: 7  MOTIVATION FOR TREATMENT: Ms. Beattie is ambivalent about treatment. Concerned about treatment side effects most notably hair thinning,  which she asked about repeatedly. She is also aware of the possible exacerbation of bipolar symptoms with IFN-based treatment; therefore is leaning towards postponing treatment until IFN-free treatment is approved, unless her biopsy demonstrates more urgent need.   CLINICAL IMPRESSIONS: Ms. Claritza July is a 50 year old single African American female with HCV, genotype 1a. She previously underwent treatment with IFN/RBV for 2 months in [place] but was discontinued  due to non-psychiatric side effects, inc  thrombocytopenia. Given several psychosocial issues, it may be best for Ms. Steptoe to wait for IFN-free regimens unless her biopsy demonstrates cirrhosis  and need for treatment in the next 1-2 years. The patient is in agreement with this plan. Our primary concern is her untreated and unmanaged bipolar disorder and reluctance to take any psychiatric medications before or during HCV treatment. She has a history of hospitalizations but mostly due to cooccurring drug abuse and bipolar sxs. Other concerns include: a) ongoing marijuana use on a daily basis; b) minimization of alcohol use as she consumed two liquor shots last night; c) misuse of opiates for pain; d) potential issues with transportation to [facility]; e) minimal social support by her boyfriend to whom she has not yet disclosed HCV status; and f) patient's concern about hair loss and body image could reduce her ability to tolerate unpleasant side effects. If her biopsy results are suggestive of cirrhosis and Dr. Jacqualine Mau believes it's imperative to start IFN treatment in the next year, he can send her back to [place] to work on pre-treatment issues such as establishing mental health care and securing support and transportation. Finally, its worth documenting that Ms. Hamlett is somewhat mistrusting of medical providers and is adamant about being kept informed about her liver disease status and involved  in treatment decisions and may become upset when this does not occur.  DSM-IV PSYCHIATRIC DIAGNOSES:  AXIS I: Bipolar disorder; alcohol and marijuana use  AXIS II: Deferred  AXIS III: HCV; PDS, chronic pain (bursitis and back pain), scoliosis AXIS IV: low social support  RECOMMENDATIONS AND PLAN:  1. Ms. Mcguffin is not a good candidate for IFN treatment at this time, primarily due to unmanaged bipolar disorder. She would need to be under the care of a psychiatrist and counselor should we need to start treatment. She is in agreement with  postponing treatment  2. The patient is obtaining a biopsy on 07/26/11 and would like to be called with those results.  3. The patient would like to defer treatment until IFN-free regimens are available, if it is medically appropriate, unless biopsy results indicate advanced liver disease, in which case, the patient would like to proceed with treatment. 4. Ms. Saulter is aware that she would need to take several steps first if IFN treatment is to be initiated, including establishment of care with a psychiatrist/counselor. We are available to work with the patient in finding appropriate referrals.  5. The patient needs to establish care with a PCP.  6. The patient was encouraged to improve several lifestyle behaviors to improve her overall and liver health.  7. If Dr. Jacqualine Mau and the patient decide to proceed with IFN treatment, we will need to see her again to re-evaluate her follow through  and readiness for treatment.  8. She has our contact info and knows to call with any questions/concerns.  Rachel A. Samara Snide, Ph.D.  Psychology Postdoctoral Fellow  Cecil Cranker. Sander Radon, Ph.D.  Licensed Clinical Psychologist [phone]  Asst Professor of Medicine  [facility] Hepatology  dd 07/25/2011 by Lupita Leash  Evon, Ph.D. dt 07/29/2011 03:26 P by Shanda Bumps:       Electronically signed on 08/10/2011 by Rae Mar

## 2011-08-30 ENCOUNTER — Ambulatory Visit (HOSPITAL_BASED_OUTPATIENT_CLINIC_OR_DEPARTMENT_OTHER): Payer: Medicaid Other | Admitting: Physical Medicine & Rehabilitation

## 2011-08-30 ENCOUNTER — Encounter: Payer: Medicaid Other | Attending: Physical Medicine & Rehabilitation

## 2011-08-30 DIAGNOSIS — M25519 Pain in unspecified shoulder: Secondary | ICD-10-CM | POA: Insufficient documentation

## 2011-08-30 DIAGNOSIS — F341 Dysthymic disorder: Secondary | ICD-10-CM | POA: Insufficient documentation

## 2011-08-30 DIAGNOSIS — R110 Nausea: Secondary | ICD-10-CM | POA: Insufficient documentation

## 2011-08-30 DIAGNOSIS — M79609 Pain in unspecified limb: Secondary | ICD-10-CM | POA: Insufficient documentation

## 2011-08-30 DIAGNOSIS — M5380 Other specified dorsopathies, site unspecified: Secondary | ICD-10-CM | POA: Insufficient documentation

## 2011-08-30 DIAGNOSIS — M542 Cervicalgia: Secondary | ICD-10-CM

## 2011-08-30 DIAGNOSIS — K5900 Constipation, unspecified: Secondary | ICD-10-CM | POA: Insufficient documentation

## 2011-08-30 NOTE — Assessment & Plan Note (Signed)
REASON FOR VISIT:  Neck pain and shoulder pain.  HISTORY:  Kathleen Carter is a 51 year old female who has had good results with the ultrasound-guided shoulder injection approximately 2 months ago, here for recheck.  She also had cervicalgia chronic pain.  She has gone through physical therapy with some moderate benefit.  She has had neck x-rays showing some left greater than right C6-7 foraminal stenosis due to cervical spondylosis.  She has had no other new medical problems. Hepatitis C seems to be under good control.  She states she has had a repeat CT abdomen which showed no evidence of scarring.  She does not have to see her hepatologist for 1 year.  Depression.  Average pain is 8/10, interferes with activity at a 6.  She has some days where she really feels like she needs to take 2 hydrocodone per day.  She has bladder control problems, weight gain and nausea.  She is a smoker.  Denies alcohol use.  Blood pressure 120/82, pulse 88, respiratory rate is 18 and O2 sat 98% on room air.  Weight 223 pounds, height 5 feet 11 inches.  PHYSICAL EXAMINATION:  She has negative impingement sign on the left shoulder.  She has good neck range of motion, some mild tenderness to palpation in the cervical paraspinal muscles, but not in the thoracic paraspinals.  Upper extremity strength is normal.  Sensation is normal in the upper extremities.  Deep tendon reflexes are normal.  IMPRESSION: 1. Cervicalgia and cervical spondylosis. 2. Subacromial bursitis.  PLAN: 1. We will change her hydrocodone to 45 tablets for 1 month, so she     could take 1 tablet on normal days and 2 tablets on days when she     experiences more pain. 2. Encouraged smoking cessation. 3. I will see her back in 3 months. 4. Continue home exercise program from physical therapy.     Erick Colace, M.D. Electronically Signed    AEK/MedQ D:  08/30/2011 12:56:31  T:  08/30/2011 08:65:78  Job #:  [id number]

## 2011-10-20 ENCOUNTER — Telehealth: Payer: Self-pay | Admitting: Physical Medicine & Rehabilitation

## 2011-10-20 MED ORDER — HYDROCODONE-ACETAMINOPHEN 7.5-325 MG PO TABS: 1.0000 | ORAL_TABLET | Freq: Every day | ORAL | Status: DC

## 2011-10-20 NOTE — Telephone Encounter (Signed)
Rx sent in for pt. She is aware.

## 2011-10-20 NOTE — Telephone Encounter (Signed)
Patient is requesting refill on Norco.  Stated that she was leaving for [place] on Saturday and will be gone for 3 weeks.  Please call in prescription.

## 2011-11-10 ENCOUNTER — Telehealth: Payer: Self-pay | Admitting: Physical Medicine & Rehabilitation

## 2011-11-10 NOTE — Telephone Encounter (Signed)
Can she have a shot in her shoulder?  Pain meds not working.

## 2011-11-11 NOTE — Telephone Encounter (Signed)
Suggestions?

## 2011-11-11 NOTE — Telephone Encounter (Signed)
Please set up for left ultrasound-guided shoulder injection

## 2011-11-11 NOTE — Telephone Encounter (Signed)
This is Kathleen Carter's patient

## 2011-11-16 ENCOUNTER — Telehealth: Payer: Self-pay | Admitting: Physical Medicine & Rehabilitation

## 2011-11-16 MED ORDER — HYDROCODONE-ACETAMINOPHEN 7.5-325 MG PO TABS: 1.0000 | ORAL_TABLET | Freq: Every day | ORAL | Status: DC

## 2011-11-16 NOTE — Telephone Encounter (Signed)
Patient requesting refill on Norco

## 2011-11-16 NOTE — Telephone Encounter (Signed)
Returning someones call 

## 2011-11-16 NOTE — Telephone Encounter (Signed)
Pt aware rx has been called in. She is also setting up her April appointment with Diane today.

## 2011-12-13 ENCOUNTER — Encounter: Payer: Self-pay | Admitting: Physical Medicine & Rehabilitation

## 2011-12-13 ENCOUNTER — Ambulatory Visit (HOSPITAL_BASED_OUTPATIENT_CLINIC_OR_DEPARTMENT_OTHER): Payer: Medicaid Other | Admitting: Physical Medicine & Rehabilitation

## 2011-12-13 ENCOUNTER — Encounter: Payer: Medicaid Other | Attending: Physical Medicine & Rehabilitation

## 2011-12-13 VITALS — BP 139/76 | HR 79 | Resp 16 | Ht 71.0 in | Wt 220.2 lb

## 2011-12-13 DIAGNOSIS — M79609 Pain in unspecified limb: Secondary | ICD-10-CM | POA: Insufficient documentation

## 2011-12-13 DIAGNOSIS — M47812 Spondylosis without myelopathy or radiculopathy, cervical region: Secondary | ICD-10-CM | POA: Insufficient documentation

## 2011-12-13 DIAGNOSIS — M25519 Pain in unspecified shoulder: Secondary | ICD-10-CM | POA: Insufficient documentation

## 2011-12-13 DIAGNOSIS — M5380 Other specified dorsopathies, site unspecified: Secondary | ICD-10-CM | POA: Insufficient documentation

## 2011-12-13 DIAGNOSIS — K5900 Constipation, unspecified: Secondary | ICD-10-CM | POA: Insufficient documentation

## 2011-12-13 DIAGNOSIS — R110 Nausea: Secondary | ICD-10-CM | POA: Insufficient documentation

## 2011-12-13 DIAGNOSIS — F341 Dysthymic disorder: Secondary | ICD-10-CM | POA: Insufficient documentation

## 2011-12-13 DIAGNOSIS — M542 Cervicalgia: Secondary | ICD-10-CM | POA: Insufficient documentation

## 2011-12-13 DIAGNOSIS — M7550 Bursitis of unspecified shoulder: Secondary | ICD-10-CM | POA: Insufficient documentation

## 2011-12-13 DIAGNOSIS — M75100 Unspecified rotator cuff tear or rupture of unspecified shoulder, not specified as traumatic: Secondary | ICD-10-CM

## 2011-12-13 MED ORDER — METHOCARBAMOL 500 MG PO TABS: 500.0000 mg | ORAL_TABLET | Freq: Two times a day (BID) | ORAL | Status: DC | PRN

## 2011-12-13 MED ORDER — HYDROCODONE-ACETAMINOPHEN 7.5-325 MG PO TABS: 1.0000 | ORAL_TABLET | Freq: Every day | ORAL | Status: DC | PRN

## 2011-12-13 NOTE — Patient Instructions (Addendum)
We'll see my PA next month for visit you can discuss her foot pain at that point Will need to repeat shoulder injection in 3-4 months. Use the muscle relaxant only on bad days do not use it all the time.

## 2011-12-13 NOTE — Progress Notes (Signed)
  Subjective:    Patient ID: Kathleen Carter, female    DOB: [birth year], 51 y.o.   MRN: [id number]  HPI  Pain Inventory Average Pain 9 Pain Right Now 7 My pain is intermittent, burning, dull, stabbing and aching  In the last 24 hours, has pain interfered with the following? General activity 7 Relation with others 7 Enjoyment of life 6 What TIME of day is your pain at its worst? morning Sleep (in general) Fair  Pain is worse with: walking Pain improves with: medication Relief from Meds: 5  Mobility walk without assistance how many minutes can you walk? 10 ability to climb steps?  yes do you drive?  no  Function disabled: date disabled 2005 Do you have any goals in this area?  no  Neuro/Psych bowel control problems trouble walking  Prior Studies Any changes since last visit?  no  Physicians involved in your care Any changes since last visit?  no      Review of Systems  Constitutional: Positive for diaphoresis.  Gastrointestinal: Positive for nausea, abdominal pain and constipation.  Musculoskeletal: Positive for gait problem.       Shoulder, left, pain  All other systems reviewed and are negative.       Objective:   Physical Exam        Assessment & Plan:  1.  Subacromial injection today   PROCEDURE: Left subacromial bursa injection under ultrasound guidance.  INDICATION: Subacromial bursitis. She has increased fluid within the  subacromial space. The pain is only partially responsive to medication  management and other conservative care.  DESCRIPTION: Informed consent was obtained after describing risks and  benefits of the procedure with the patient. These include bleeding,  bruising, and infection. She elects to proceed and has given a written  consent. The patient was placed in a seated position. Area pre-scanned  and marked and prepped with Betadine alcohol. Using a sterile probe  cover and sterile technique, a 25-gauge inch and half needle  was first  used to anesthetize the skin and subcu tissue 1% lidocaine x2 mL, then a  22-gauge 40-mm EchoBlock needle was inserted under fluoroscopic guidance  starting at the left subacromial bursa. The needle was placed without  difficulty into the bursa and 3 mL of solution containing 40 mg/mL Depo-  Medrol and 3 mL of 1% MPF lidocaine were injected. The patient  tolerated the procedure well. Postprocedure instructions were given.  Follow up in 1 month [facility].

## 2012-01-05 ENCOUNTER — Telehealth: Payer: Self-pay | Admitting: *Deleted

## 2012-01-05 NOTE — Telephone Encounter (Signed)
Son is graduating in [place] and she is leaving tomorrow night to go there and will not be back until 01/23/12.  She has an appt 01/12/12 that she is going to cancel and reschedule for after she returns, but would like to have her rx to take with her. (did not state what rx)

## 2012-01-05 NOTE — Telephone Encounter (Signed)
Pt advised to find  A pharmacy near her and request medication closer to when it is due and we can call it in then. Pt agrees.

## 2012-01-10 ENCOUNTER — Telehealth: Payer: Self-pay

## 2012-01-10 MED ORDER — HYDROCODONE-ACETAMINOPHEN 7.5-325 MG PO TABS: 1.0000 | ORAL_TABLET | Freq: Every day | ORAL | Status: DC | PRN

## 2012-01-10 NOTE — Telephone Encounter (Signed)
Pt called requesting refill on medication but did not clarify medication.  He would like it called into [facility] in [place].

## 2012-01-10 NOTE — Telephone Encounter (Signed)
Hydrocodone called in 

## 2012-01-17 ENCOUNTER — Encounter: Payer: Medicaid Other | Admitting: Physical Medicine and Rehabilitation

## 2012-02-09 ENCOUNTER — Other Ambulatory Visit: Payer: Self-pay | Admitting: *Deleted

## 2012-02-09 MED ORDER — HYDROCODONE-ACETAMINOPHEN 7.5-325 MG PO TABS: 1.0000 | ORAL_TABLET | Freq: Every day | ORAL | Status: DC | PRN

## 2012-02-10 ENCOUNTER — Telehealth: Payer: Self-pay | Admitting: Physical Medicine & Rehabilitation

## 2012-02-10 MED ORDER — METHOCARBAMOL 500 MG PO TABS: 500.0000 mg | ORAL_TABLET | Freq: Two times a day (BID) | ORAL | Status: DC | PRN

## 2012-02-10 NOTE — Telephone Encounter (Signed)
Pt aware that Norco was called in yesterday and Robaxin was sent in today.

## 2012-02-10 NOTE — Telephone Encounter (Signed)
Refill on Norco and muscle relaxer.

## 2012-03-05 ENCOUNTER — Other Ambulatory Visit: Payer: Self-pay | Admitting: *Deleted

## 2012-03-05 MED ORDER — HYDROCODONE-ACETAMINOPHEN 7.5-325 MG PO TABS: 1.0000 | ORAL_TABLET | Freq: Every day | ORAL | Status: DC | PRN

## 2012-03-08 ENCOUNTER — Telehealth: Payer: Self-pay | Admitting: Physical Medicine & Rehabilitation

## 2012-03-08 NOTE — Telephone Encounter (Signed)
Refill on Norco

## 2012-03-09 NOTE — Telephone Encounter (Signed)
Pt aware that her rx was called in on 03/05/12.

## 2012-04-02 ENCOUNTER — Other Ambulatory Visit: Payer: Self-pay | Admitting: *Deleted

## 2012-04-02 MED ORDER — HYDROCODONE-ACETAMINOPHEN 7.5-325 MG PO TABS: 1.0000 | ORAL_TABLET | Freq: Every day | ORAL | Status: DC | PRN

## 2012-04-22 ENCOUNTER — Encounter (HOSPITAL_BASED_OUTPATIENT_CLINIC_OR_DEPARTMENT_OTHER): Payer: Self-pay | Admitting: *Deleted

## 2012-04-22 ENCOUNTER — Emergency Department (HOSPITAL_BASED_OUTPATIENT_CLINIC_OR_DEPARTMENT_OTHER)
Admission: EM | Admit: 2012-04-22 | Discharge: 2012-04-22 | Disposition: A | Discharge status: Home / Self Care | Payer: Medicaid Other | Attending: Emergency Medicine | Admitting: Emergency Medicine

## 2012-04-22 ENCOUNTER — Emergency Department (HOSPITAL_BASED_OUTPATIENT_CLINIC_OR_DEPARTMENT_OTHER): Payer: Medicaid Other

## 2012-04-22 DIAGNOSIS — F17200 Nicotine dependence, unspecified, uncomplicated: Secondary | ICD-10-CM | POA: Insufficient documentation

## 2012-04-22 DIAGNOSIS — T148XXA Other injury of unspecified body region, initial encounter: Secondary | ICD-10-CM

## 2012-04-22 DIAGNOSIS — X58XXXA Exposure to other specified factors, initial encounter: Secondary | ICD-10-CM | POA: Insufficient documentation

## 2012-04-22 LAB — CBC WITH DIFFERENTIAL/PLATELET
HCT: 38.9 % (ref 36.0–46.0)
Hemoglobin: 13.1 g/dL (ref 12.0–15.0)
Lymphocytes Relative: 41 % (ref 12–46)
Monocytes Absolute: 0.6 10*3/uL (ref 0.1–1.0)
Monocytes Relative: 11 % (ref 3–12)
Neutro Abs: 2.5 10*3/uL (ref 1.7–7.7)
RBC: 5.16 MIL/uL — ABNORMAL HIGH (ref 3.87–5.11)
WBC: 5.3 10*3/uL (ref 4.0–10.5)

## 2012-04-22 LAB — BASIC METABOLIC PANEL
BUN: 18 mg/dL (ref 6–23)
CO2: 26 mEq/L (ref 19–32)
Chloride: 105 mEq/L (ref 96–112)
Creatinine, Ser: 0.8 mg/dL (ref 0.50–1.10)

## 2012-04-22 LAB — TROPONIN I: Troponin I: <0.3 ng/mL (ref <0.30)

## 2012-04-22 MED ORDER — KETOROLAC TROMETHAMINE 60 MG/2ML IM SOLN
60.0000 mg | Freq: Once | INTRAMUSCULAR | Status: AC
  Administered 2012-04-22: 60 mg via INTRAMUSCULAR

## 2012-04-22 MED ORDER — NAPROXEN 375 MG PO TABS: 375.0000 mg | ORAL_TABLET | Freq: Two times a day (BID) | ORAL | Status: AC

## 2012-04-22 MED ORDER — KETOROLAC TROMETHAMINE 30 MG/ML IJ SOLN: 30.0000 mg | Freq: Once | INTRAMUSCULAR | Status: DC

## 2012-04-22 MED ORDER — KETOROLAC TROMETHAMINE 60 MG/2ML IM SOLN
INTRAMUSCULAR | Status: AC
  Administered 2012-04-22: 60 mg via INTRAMUSCULAR
  Filled 2012-04-22: qty 2

## 2012-04-22 MED ORDER — TRAMADOL HCL 50 MG PO TABS: 50.0000 mg | ORAL_TABLET | Freq: Four times a day (QID) | ORAL | Status: AC | PRN

## 2012-04-22 NOTE — ED Notes (Addendum)
C/o left shoulder pain that started on Friday morning. Denies any injury but states she has "bursitis" in her   shoulder and gets cortisone shots. Last April 2013. desbribes as a constant dull/ pain. Denies sob or n/v. resp even and unlabored

## 2012-04-22 NOTE — ED Provider Notes (Signed)
History     CSN: [id number]  Arrival date & time 04/22/12  0023   First MD Initiated Contact with Patient 04/22/12 0040      Chief Complaint  Patient presents with  . Shoulder Pain    (Consider location/radiation/quality/duration/timing/severity/associated sxs/prior treatment) Patient is a 51 y.o. female presenting with shoulder pain. The history is provided by the patient. No language interpreter was used.  Shoulder Pain This is a new problem. The current episode started more than 2 days ago. The problem occurs constantly. The problem has not changed since onset.Pertinent negatives include no abdominal pain, no headaches and no shortness of breath. Exacerbated by: movement and palpation. Nothing relieves the symptoms. Treatments tried: norco and muscle relaxant. The treatment provided no relief.  Pain is in the posterior and anterior shoulder and all the way down to the antecubital fossa.  Moves into left breast with movement but no chest pain per say.  No DOE no SOB no cough no leg swelling  Past Medical History  Diagnosis Date  . Neuromuscular disorder   . Hepatitis C     Past Surgical History  Procedure Date  . Abdominal hysterectomy     Family History  Problem Relation Age of Onset  . Hypertension Mother   . Hyperlipidemia Mother   . Depression Father     History  Substance Use Topics  . Smoking status: Current Everyday Smoker -- 1.0 packs/day    Types: Cigarettes  . Smokeless tobacco: Never Used  . Alcohol Use: No    OB History    Grav Para Term Preterm Abortions TAB SAB Ect Mult Living                  Review of Systems  Constitutional: Negative for fever and diaphoresis.  HENT: Negative for neck pain.   Respiratory: Negative for chest tightness and shortness of breath.   Cardiovascular: Negative for palpitations and leg swelling.  Gastrointestinal: Negative for abdominal pain.  Neurological: Negative for weakness, numbness and headaches.  All other  systems reviewed and are negative.    Allergies  Tylenol  Home Medications   Current Outpatient Rx  Name Route Sig Dispense Refill  . HYDROCODONE-ACETAMINOPHEN 7.5-325 MG PO TABS Oral Take 1 tablet by mouth daily as needed. May take an additional tablet on bad days 60 tablet 0  . METHOCARBAMOL 500 MG PO TABS Oral Take 750 mg by mouth 2 (two) times daily as needed. For pain    . THERA M PLUS PO TABS Oral Take 1 tablet by mouth daily.      Marland Kitchen NAPROXEN 375 MG PO TABS Oral Take 1 tablet (375 mg total) by mouth 2 (two) times daily. 20 tablet 0  . TRAMADOL HCL 50 MG PO TABS Oral Take 1 tablet (50 mg total) by mouth every 6 (six) hours as needed for pain. 15 tablet 0    BP 124/68  Pulse 60  Temp 98 F (36.7 C) (Oral)  Resp 18  Ht 5\' 11"  (1.803 m)  Wt 211 lb (95.709 kg)  BMI 29.43 kg/m2  SpO2 98%  Physical Exam  Constitutional: She is oriented to person, place, and time. She appears well-developed and well-nourished. No distress.  HENT:  Head: Normocephalic and atraumatic.  Mouth/Throat: Oropharynx is clear and moist.  Eyes: Conjunctivae are normal. Pupils are equal, round, and reactive to light.  Neck: Normal range of motion. Neck supple.  Cardiovascular: Normal rate and regular rhythm.   Pulmonary/Chest: Effort normal and breath  sounds normal. She exhibits tenderness.  Abdominal: Soft. Bowel sounds are normal. There is no tenderness. There is no rebound and no guarding.  Musculoskeletal: Normal range of motion. She exhibits no edema and no tenderness.       Left Bicep tendon intact no winging of the scapula 5/5 LUE strength intact sensation  Neurological: She is alert and oriented to person, place, and time. She has normal reflexes.  Skin: Skin is warm and dry.  Psychiatric: She has a normal mood and affect.    ED Course  Procedures (including critical care time)  Labs Reviewed  CBC WITH DIFFERENTIAL - Abnormal; Notable for the following:    RBC 5.16 (*)     MCV 75.4 (*)      MCH 25.4 (*)     All other components within normal limits  BASIC METABOLIC PANEL - Abnormal; Notable for the following:    GFR calc non Af Amer 83 (*)     All other components within normal limits  TROPONIN I   Dg Chest 2 View  04/22/2012  *RADIOLOGY REPORT*  Clinical Data: Shoulder pain  CHEST - 2 VIEW  Comparison: 07/26/2011  Findings: The heart size and mediastinal contours are within normal limits.  Both lungs are clear.  The visualized skeletal structures are unremarkable.  IMPRESSION: Negative exam.   Original Report Authenticated By: Rosealee Albee, M.D.      1. Muscle strain       MDM  Atypical for ACS.  Pain constant x 3 days and in the setting of pain of this duration with negative ekg and one negative troponin is sufficient to exclude ACS.  Follow up with PMD for ongoing care suspect patient is out of her typical dose of norco        Hulda Reddix K Tashera Montalvo-Rasch, MD 04/22/12 [id number]

## 2012-04-22 NOTE — ED Notes (Signed)
D/c home with rx x 2 for naprosyn and tramadol

## 2012-04-22 NOTE — ED Notes (Signed)
MD at bedside. 

## 2012-04-30 ENCOUNTER — Telehealth: Payer: Self-pay | Admitting: *Deleted

## 2012-04-30 MED ORDER — HYDROCODONE-ACETAMINOPHEN 7.5-325 MG PO TABS: 1.0000 | ORAL_TABLET | Freq: Every day | ORAL | Status: AC | PRN

## 2012-04-30 NOTE — Telephone Encounter (Signed)
Refill Hydrocodone  Rx has been called in, pt aware.

## 2012-05-01 ENCOUNTER — Encounter: Payer: Medicaid Other | Attending: Physical Medicine & Rehabilitation

## 2012-05-01 ENCOUNTER — Ambulatory Visit (HOSPITAL_BASED_OUTPATIENT_CLINIC_OR_DEPARTMENT_OTHER): Payer: Medicaid Other | Admitting: Physical Medicine & Rehabilitation

## 2012-05-01 ENCOUNTER — Other Ambulatory Visit: Payer: Self-pay | Admitting: Physical Medicine and Rehabilitation

## 2012-05-01 ENCOUNTER — Encounter: Payer: Self-pay | Admitting: Physical Medicine & Rehabilitation

## 2012-05-01 VITALS — BP 151/78 | HR 95 | Resp 16 | Ht 71.0 in | Wt 223.0 lb

## 2012-05-01 DIAGNOSIS — M75100 Unspecified rotator cuff tear or rupture of unspecified shoulder, not specified as traumatic: Secondary | ICD-10-CM | POA: Insufficient documentation

## 2012-05-01 DIAGNOSIS — R209 Unspecified disturbances of skin sensation: Secondary | ICD-10-CM | POA: Insufficient documentation

## 2012-05-01 DIAGNOSIS — M25519 Pain in unspecified shoulder: Secondary | ICD-10-CM | POA: Insufficient documentation

## 2012-05-01 DIAGNOSIS — IMO0002 Reserved for concepts with insufficient information to code with codable children: Secondary | ICD-10-CM | POA: Insufficient documentation

## 2012-05-01 DIAGNOSIS — M7550 Bursitis of unspecified shoulder: Secondary | ICD-10-CM

## 2012-05-01 NOTE — Addendum Note (Signed)
Addended by: Caryl Ada on: 05/01/2012 12:03 PM   Modules accepted: Orders

## 2012-05-01 NOTE — Progress Notes (Signed)
  Subjective:    Patient ID: Kathleen Carter, female    DOB: Dec 03, 1960, 51 y.o.   MRN: [id number]  Shoulder Pain  Associated symptoms include numbness.   Pain Inventory Average Pain 9 Pain Right Now 7 My pain is burning, dull and aching  In the last 24 hours, has pain interfered with the following? General activity 7 Relation with others 0 Enjoyment of life 7 What TIME of day is your pain at its worst? Varies Sleep (in general) Fair  Pain is worse with: bending Pain improves with: heat/ice and medication Relief from Meds: 4  Mobility how many minutes can you walk? 30 do you drive?  no  Function disabled: date disabled   Neuro/Psych numbness  Prior Studies Any changes since last visit?  no  Physicians involved in your care Any changes since last visit?  no   Family History  Problem Relation Age of Onset  . Hypertension Mother   . Hyperlipidemia Mother   . Depression Father    History   Social History  . Marital Status: Single    Spouse Name: N/A    Number of Children: N/A  . Years of Education: N/A   Social History Main Topics  . Smoking status: Current Everyday Smoker -- 1.0 packs/day    Types: Cigarettes  . Smokeless tobacco: Never Used  . Alcohol Use: No  . Drug Use: No  . Sexually Active: None   Other Topics Concern  . None   Social History Narrative  . None   Past Surgical History  Procedure Date  . Abdominal hysterectomy    Past Medical History  Diagnosis Date  . Neuromuscular disorder   . Hepatitis C    Review of Systems  Constitutional: Positive for diaphoresis.  HENT: Negative.   Eyes: Negative.   Respiratory: Negative.   Cardiovascular: Negative.   Gastrointestinal: Negative.   Genitourinary: Negative.   Musculoskeletal: Positive for myalgias and arthralgias.  Skin: Negative.   Neurological: Positive for numbness.  Hematological: Negative.   Psychiatric/Behavioral: Negative.        Objective:   Physical  Exam        Assessment & Plan:  Indication: Subacromial bursitis. She has increased fluid within the  subacromial space. The pain is only partially responsive to medication  management and other conservative care.  DESCRIPTION: Left subacromial bursa injection under ultrasound guidance Informed consent was obtained after describing risks and  benefits of the procedure with the patient. These include bleeding,  bruising, and infection. She elects to proceed and has given a written  consent. The patient was placed in a seated position. Area pre-scanned  and marked and prepped with Betadine alcohol. Using a sterile probe  cover and sterile technique, a 25-gauge inch and half needle was first  used to anesthetize the skin and subcu tissue 1% lidocaine x2 mL, then a  22-gauge 40-mm needle was inserted under fluoroscopic guidance  starting at the left subacromial bursa. The needle was placed without  difficulty into the bursa and 5 mL of solution containing 1mL 40 mg/mL Depo-  Medrol and 4mL of 1% MPF lidocaine were injected. The patient  tolerated the procedure well. Postprocedure instructions were given.  Follow up in 3 Months for repeat injection.

## 2012-05-01 NOTE — Patient Instructions (Signed)
Will repeat this injection in 3 months.

## 2012-05-04 ENCOUNTER — Telehealth: Payer: Self-pay | Admitting: *Deleted

## 2012-05-04 NOTE — Telephone Encounter (Signed)
Inconsistent UDS. Positive for Heroin. Per Dr. Wynn Banker, discharge pt and to refer to the [facility].  Pt aware of discharge status. Please send discharge letter.  Thanks.

## 2012-05-07 ENCOUNTER — Telehealth: Payer: Self-pay | Admitting: Physical Medicine & Rehabilitation

## 2012-05-07 NOTE — Telephone Encounter (Signed)
UDS wrong.  What was in urine?  It is unjust.  Wants blood test.  Please call.

## 2012-05-08 NOTE — Telephone Encounter (Signed)
Left message for patient to call back  

## 2012-05-09 ENCOUNTER — Encounter: Payer: Self-pay | Admitting: Physical Medicine & Rehabilitation

## 2012-05-09 NOTE — Telephone Encounter (Signed)
done

## 2012-05-29 ENCOUNTER — Telehealth: Payer: Self-pay | Admitting: *Deleted

## 2012-05-29 ENCOUNTER — Ambulatory Visit: Payer: Medicaid Other | Admitting: Physical Medicine & Rehabilitation

## 2012-05-29 NOTE — Telephone Encounter (Signed)
Norco refill.  Pt aware that we are no longer treating her so we can't refill medication. I gave her the # and address to the [facility].

## 2013-11-11 IMAGING — CR DG CHEST 2V
2 series · 2 of 2 positions shown · non-contrast
Comparison: 07/26/2011

CLINICAL DATA: Shoulder pain

CHEST - 2 VIEW

[w chest pa]
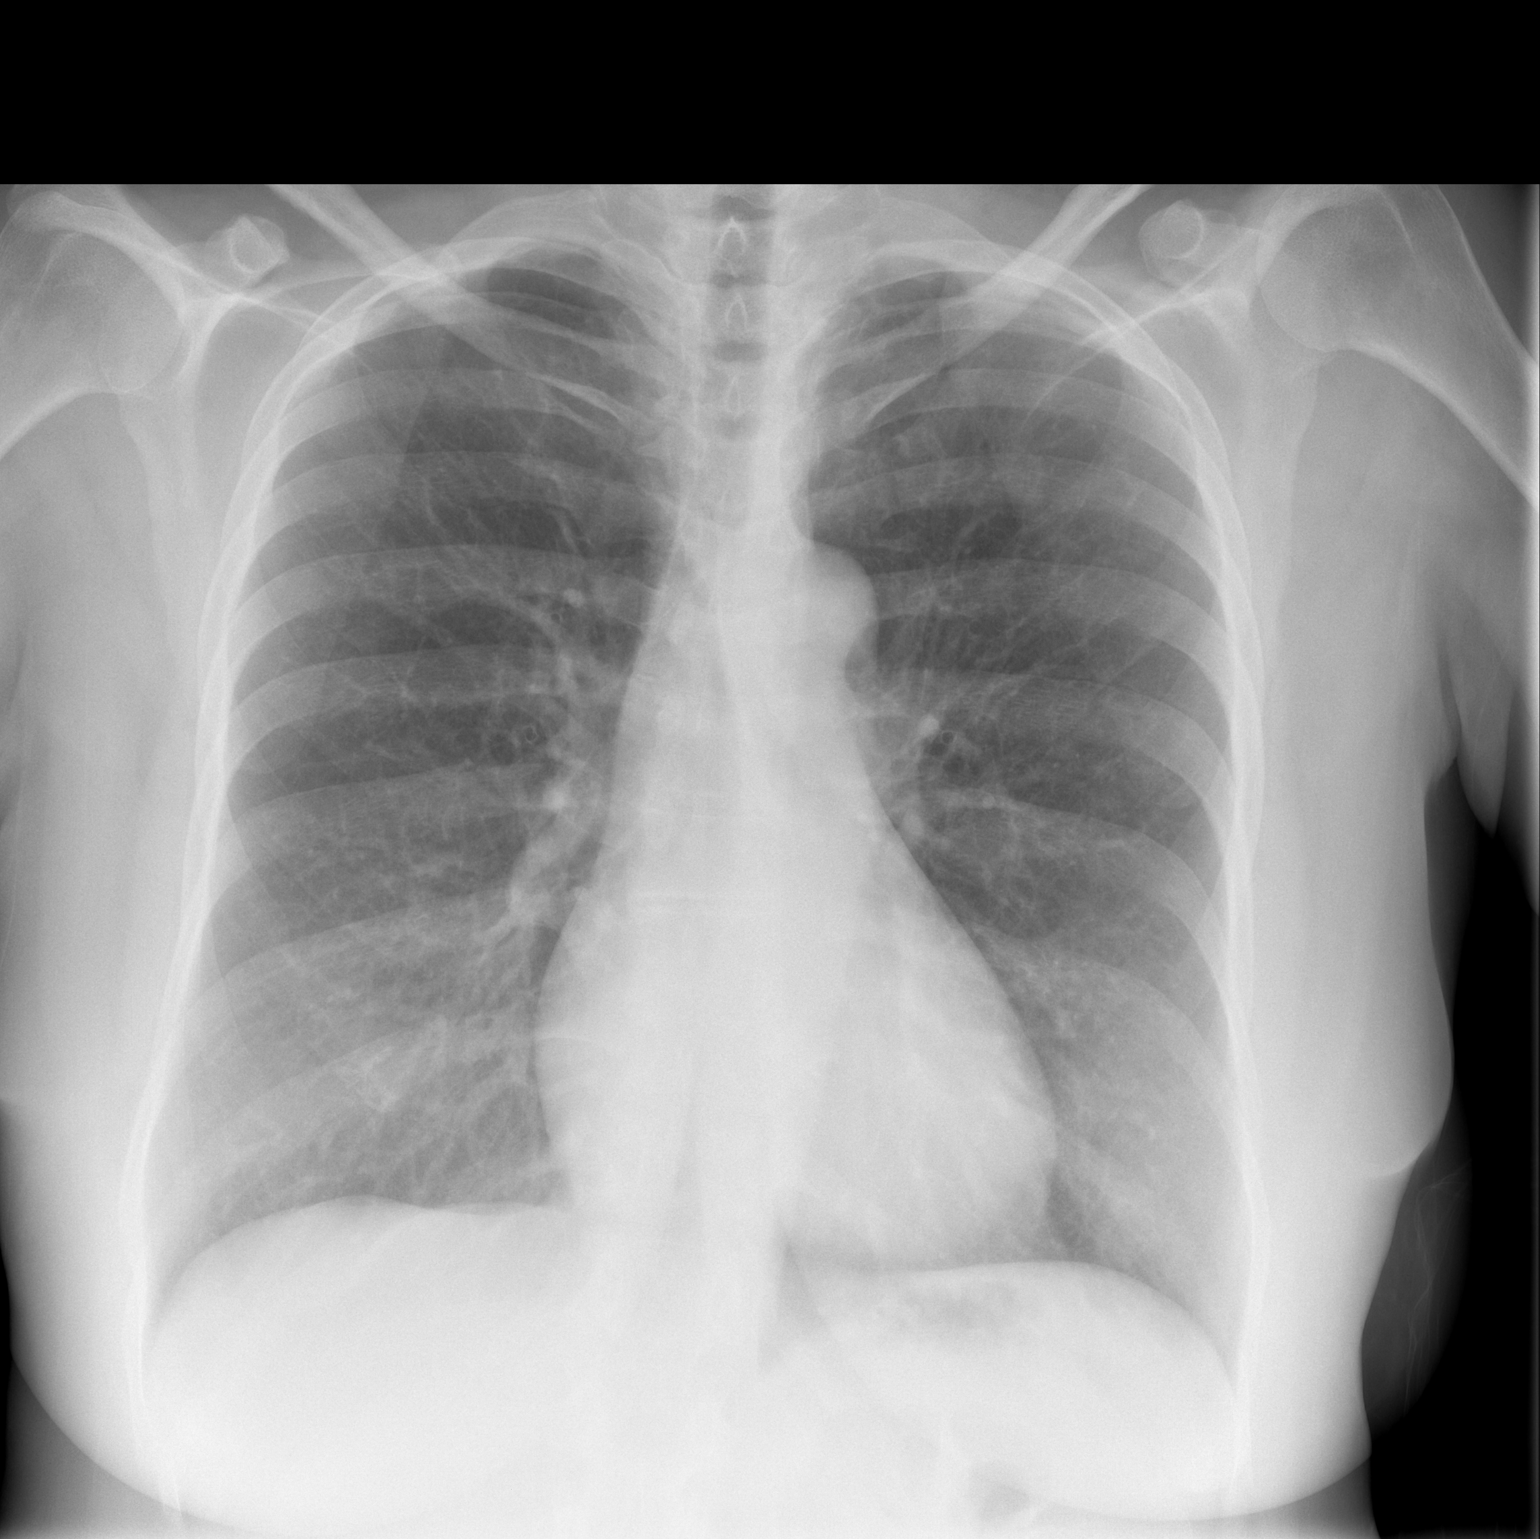

[w chest lat]
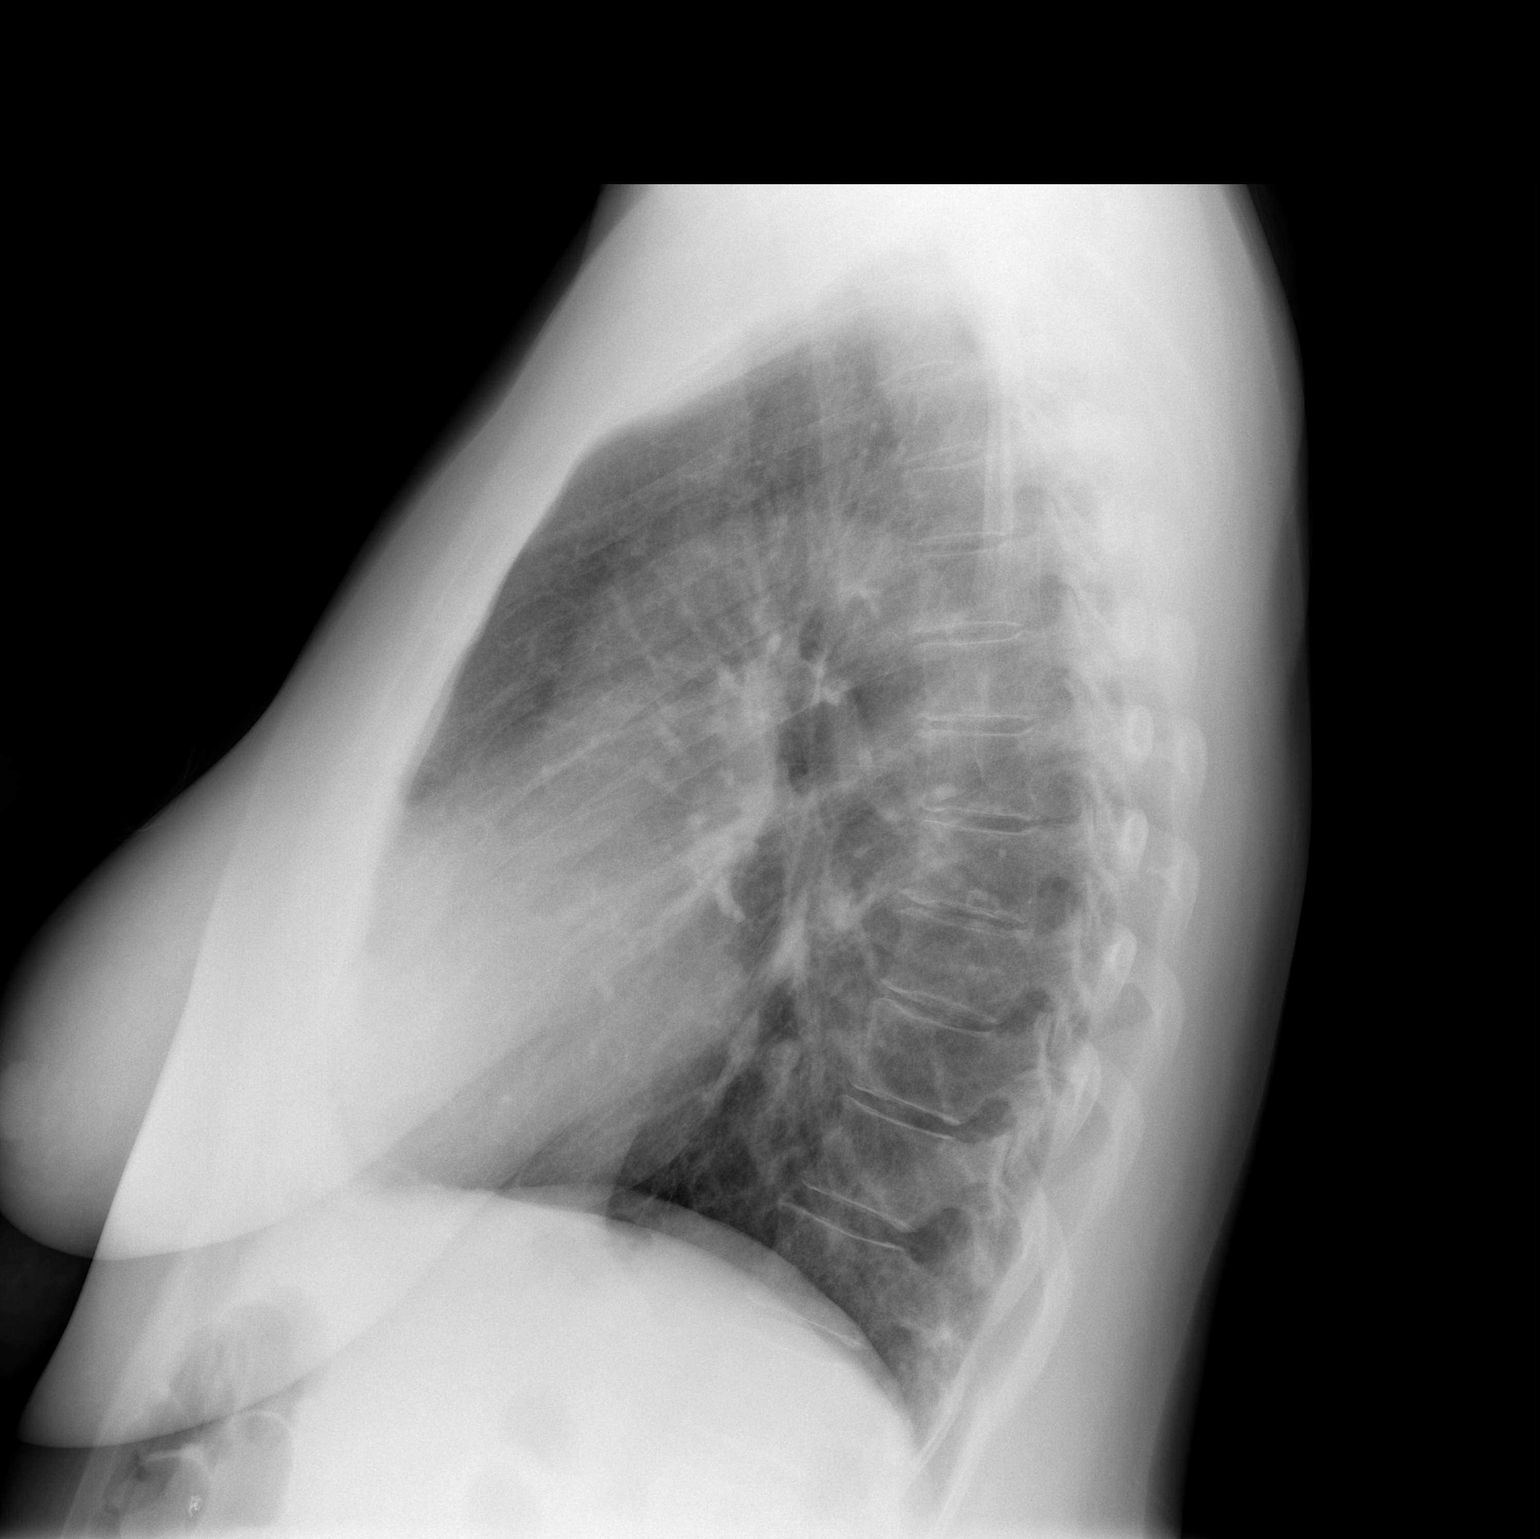

[2 of 2 positions shown; findings below may reference images not displayed]

FINDINGS: The heart size and mediastinal contours are within normal
limits.  Both lungs are clear.  The visualized skeletal structures
are unremarkable.
IMPRESSION: Negative exam.

## 2014-10-16 ENCOUNTER — Other Ambulatory Visit (HOSPITAL_COMMUNITY): Payer: Self-pay | Admitting: Gastroenterology

## 2014-10-16 ENCOUNTER — Other Ambulatory Visit: Payer: Self-pay | Admitting: Gastroenterology

## 2014-10-16 DIAGNOSIS — B182 Chronic viral hepatitis C: Secondary | ICD-10-CM

## 2014-10-24 ENCOUNTER — Ambulatory Visit (HOSPITAL_COMMUNITY): Payer: Medicaid Other

## 2014-11-03 ENCOUNTER — Ambulatory Visit (HOSPITAL_COMMUNITY): Payer: Medicaid Other

## 2014-11-12 ENCOUNTER — Ambulatory Visit (HOSPITAL_COMMUNITY)
Admission: RE | Admit: 2014-11-12 | Discharge: 2014-11-12 | Disposition: A | Discharge status: Home / Self Care | Payer: Medicaid Other | Source: Ambulatory Visit | Attending: Gastroenterology | Admitting: Gastroenterology

## 2014-11-19 ENCOUNTER — Telehealth (HOSPITAL_COMMUNITY): Payer: Self-pay | Admitting: Radiology

## 2014-11-24 NOTE — Telephone Encounter (Signed)
Pt has cancelled 3x for her u/s scan, 3-11, 3-14, 3-23.  I have faxed this info to Dr. Jacqualine MauZacks.

## 2017-07-25 ENCOUNTER — Ambulatory Visit
Admission: RE | Admit: 2017-07-25 | Discharge: 2017-07-25 | Disposition: A | Discharge status: Home / Self Care | Payer: MEDICAID | Attending: Gastroenterology | Admitting: Gastroenterology

## 2017-07-25 DIAGNOSIS — B182 Chronic viral hepatitis C: Principal | ICD-10-CM

## 2017-08-07 ENCOUNTER — Other Ambulatory Visit: Payer: Self-pay | Admitting: Gastroenterology

## 2017-08-07 DIAGNOSIS — E8889 Other specified metabolic disorders: Secondary | ICD-10-CM

## 2017-08-07 DIAGNOSIS — B182 Chronic viral hepatitis C: Secondary | ICD-10-CM

## 2017-08-07 DIAGNOSIS — E7889 Other lipoprotein metabolism disorders: Secondary | ICD-10-CM

## 2017-10-03 ENCOUNTER — Ambulatory Visit
Admission: RE | Admit: 2017-10-03 | Discharge: 2017-10-03 | Disposition: A | Discharge status: Home / Self Care | Payer: Medicaid Other | Source: Ambulatory Visit | Attending: Gastroenterology | Admitting: Gastroenterology

## 2017-10-03 DIAGNOSIS — E7889 Other lipoprotein metabolism disorders: Secondary | ICD-10-CM

## 2017-10-03 DIAGNOSIS — B182 Chronic viral hepatitis C: Secondary | ICD-10-CM

## 2017-10-03 DIAGNOSIS — E8889 Other specified metabolic disorders: Secondary | ICD-10-CM

## 2017-10-19 MED ORDER — GLECAPREVIR 100 MG-PIBRENTASVIR 40 MG TABLET
Freq: Every day | ORAL | 1 refills | 0.00000 days | Status: CP
Start: 2017-10-19 — End: 2017-12-14

## 2017-10-19 MED ORDER — GLECAPREVIR 100 MG-PIBRENTASVIR 40 MG TABLET: 3 | carton | Freq: Every day | 1 refills | 0 days | Status: AC

## 2017-10-19 NOTE — Unmapped (Signed)
Per test claim for MAVYRET at the [facility], patient needs Medication Assistance Program for PRIOR AUTHORIZATION.

## 2017-10-19 NOTE — Unmapped (Signed)
Specialty Medication Referral    Medication: Mavyret  Duration: 8 weeks  Diagnosis:   B18.2 Hep C: yes    K74.60 Cirrhosis: no,   Child Pugh Score if applicable and for Medicaid pts: N/A  Z94.4 Liver Transplant: no  Genotype? 1a  HCV RNA: 161,096 Iu/Ml   Date: 08/25/2017  Fibrosis score: F2-f3  HIV Co-infection? no  Signs of liver decompensation? No  Previous treatment? yes  Non-responder? Peg/RBV x 2 months.  Continue per patient due to thrombocytopenia.  Response unknown.  Previous length of therapy 2 months   Previous medications -    Thanks,  Julio Sicks

## 2017-10-31 NOTE — Unmapped (Signed)
PA approved for Mavyret from 10/30/17 to 12/29/17 for $3

## 2017-11-06 MED FILL — MAVYRET/100-40MG/TABS: MAVYRET/100-40MG/TABS | 28 days supply | Qty: 84 | Fill #0

## 2017-11-06 NOTE — Unmapped (Signed)
Initial Counseling for HCV treatment     Planned regimen: Mavyret (glecaprevir/pibrentasvir 100/40 mg) 3 tabs daily x 8 weeks  Planned start date: 11/08/17    Pharmacy: [facility] [phone] option #4    PMH:   ??? Osteoarthritis of cervical spine   ??? Chronic hepatitis C (CMS-HCC)   ??? Chronic pain       Current Meds:   Current Outpatient Prescriptions   Medication Sig Dispense Refill   ??? promethazine (PHENERGAN) 25 MG tablet 3 mg.       Patient is ready to start Mavyret.    Following topics were discussed during counseling:   Patient Counseling    Counseled the patient on the following:  doses and administration discussed, safe handling, storage, and disposal discussed, possible adverse effects and management discussed, possible drug and prescription drug interactions discussed, possible drug and OTC drug and food interactions discussed, lab monitoring and follow-up discussed, adherence and missed doses discussed, pharmacy contact information discussed        1. Indications for medication, dosage and administration.     A. Mavyret (100/40 mg) 3 tablets to take daily with food. Patient plans to take in the morning after breakfast.     2. Common side effects of medications and management strategies. (fatigue, headache)      3. Importance of adherence to regimen, follow-up clinic visits and lab monitoring.   Medication Adherence    Demonstrates understanding of importance of adherence:  yes  Informant:  patient  Patient is at risk for Non-Adherence:  No  Reasons for non-adherence:  no problems identified   Other adherence tool:  routine   Confirmed plan for next specialty medication refill:  delivery by pharmacy  Refills needed for supportive medications:  not needed       A. Asked patient to call [facility] [phone] to establish start date for treatment and to schedule appointment 4 weeks before starting treatment.      4. Drug-drug interaction.  Drug Interactions    Drug interactions evaluated:  yes  Clinically relevant drug interactions identified:  no  Provided the patient with educational material regarding drug interactions:  not applicable       A. Current medications have been reviewed and assessed for possible interaction.    Denies use of herbal medication such as milk thistle or [place]. John's wart.  Allergies have been verified.      5. Importance of informing pharmacy and clinic of updated contact information.  Stressed importance of being able to reach over the phone to set up refills. Advised patient to call pharmacy when down to about 7 day supply left to ensure there's no interruption in therapy.  Refill Coordination    Has the Patients' Contact Information Changed:  No  Is the Shipping Address Different:  Yes  Updated Shipping Address:  [address] [place] [address]  Note: Above listed address is patient's daughter's address. Please confirm refill delivery address.        Shipping Information    Delivery Scheduled:  Yes  Delivery Date:  11/07/17  Medications to be Shipped:  Mavyret       Patient verbalized understanding. Provided contact information for any questions/concerns.     Carole Binning  PharmD Candidate    Park Breed, Pharm D., BCPS, BCGP, CPP  [facility]  [address]  [place], [place] [address]  [phone]      November 06, 2017 9:17 AM

## 2017-11-06 NOTE — Unmapped (Signed)
Per Park Breed, onboarding has been documented, copay has been reviewed, and delivery scheduled for 3/19.    Belinda Kidd   [facility] Specialty Technician

## 2017-11-23 NOTE — Unmapped (Signed)
Specialty Pharmacy - Hepatitis C Medication Refill Coordination      Belinda Kidd is a 57 y.o. female contacted today regarding refills of her specialty medication(s).MAVYRET      Treatment start date: 11/10/17  Treatment duration: 8 weeks    Current treatment week: 2  14 DAYS LEFT     Reviewed and verified with patient:      Specialty medication(s) and dose(s) confirmed: yes  Changes to medications: no  Changes to insurance: no    Medication Adherence    Patient Reported X Missed Doses in the Last Month:  0  Specialty Medication:  MAVYRET #OH 14 DAYS   Patient is on additional specialty medications:  No  Informant:  patient  Other Adherence Tool:  routine  Support Network for Adherence:  family member, healthcare provider  Confirmed Plan for Next Specialty Medication Refill:  delivery by pharmacy  Refills Needed for Supportive Medications:  not needed  Medication Assistance Program  Refill Coordination  Has the Patients' Contact Information Changed:  No    Is the Shipping Address Different:  No    Shipping Information  Delivery Scheduled:  Yes  Delivery Date:  11/29/17  Medications to be Shipped:  MAVYRET          Drug Interactions    Clinically relevant drug interactions identified:  no           PATIENTS LAST REFILL. REPORTS NO SIDE EFFECTS       Follow-up: 0 day(s)  Does Belinda Kidd have follow up appointment scheduled with clinic? Yes, appointment is scheduled and patient is aware  The patient will receive an FSI print out for each medication shipped and additional FDA Medication Guides as required.  Patient education from [employer] may also be included in the shipment.    Ledora Bottcher

## 2017-11-27 MED FILL — MAVYRET/100-40MG/TABS: MAVYRET/100-40MG/TABS | 28 days supply | Qty: 84 | Fill #1

## 2017-12-12 ENCOUNTER — Encounter
Admit: 2017-12-12 | Discharge: 2017-12-13 | Payer: PRIVATE HEALTH INSURANCE | Attending: Gastroenterology | Primary: Gastroenterology

## 2017-12-12 DIAGNOSIS — B182 Chronic viral hepatitis C: Principal | ICD-10-CM

## 2017-12-12 LAB — CBC W/ AUTO DIFF
BASOPHILS ABSOLUTE COUNT: 0 10*9/L (ref 0.0–0.1)
BASOPHILS RELATIVE PERCENT: 0.7 %
EOSINOPHILS ABSOLUTE COUNT: 0.1 10*9/L (ref 0.0–0.4)
EOSINOPHILS RELATIVE PERCENT: 1.4 %
HEMATOCRIT: 44.2 % (ref 36.0–46.0)
HEMOGLOBIN: 13.5 g/dL (ref 12.0–16.0)
LYMPHOCYTES RELATIVE PERCENT: 48.9 %
MEAN CORPUSCULAR HEMOGLOBIN: 25.1 pg — ABNORMAL LOW (ref 26.0–34.0)
MEAN CORPUSCULAR VOLUME: 82.1 fL (ref 80.0–100.0)
MEAN PLATELET VOLUME: 8.6 fL (ref 7.0–10.0)
MONOCYTES ABSOLUTE COUNT: 0.3 10*9/L (ref 0.2–0.8)
MONOCYTES RELATIVE PERCENT: 6.5 %
NEUTROPHILS RELATIVE PERCENT: 40.1 %
PLATELET COUNT: 194 10*9/L (ref 150–440)
RED BLOOD CELL COUNT: 5.39 10*12/L — ABNORMAL HIGH (ref 4.00–5.20)
RED CELL DISTRIBUTION WIDTH: 14 % (ref 12.0–15.0)
WBC ADJUSTED: 3.9 10*9/L — ABNORMAL LOW (ref 4.5–11.0)

## 2017-12-12 LAB — CREATININE
CREATININE: 0.8 mg/dL (ref 0.60–1.00)
EGFR MDRD NON AF AMER: >=60 mL/min/{1.73_m2} (ref >=60)

## 2017-12-12 LAB — HEPATIC FUNCTION PANEL
ALKALINE PHOSPHATASE: 64 U/L (ref 38–126)
ALT (SGPT): 15 U/L (ref 15–48)
AST (SGOT): 24 U/L (ref 14–38)
BILIRUBIN TOTAL: 1 mg/dL (ref 0.0–1.2)

## 2017-12-12 LAB — EGFR MDRD NON AF AMER: Glomerular filtration rate/1.73 sq M.predicted.non black:ArVRat:Pt:Ser/Plas/Bld:Qn:Creatinine-based formula (MDRD): >=60

## 2017-12-12 LAB — PLATELET COUNT: Lab: 194

## 2017-12-12 LAB — AFP TUMOR MARKER: AFP-TUMOR MARKER: 10.4 ng/mL — ABNORMAL HIGH (ref <7.51)

## 2017-12-12 LAB — SMEAR REVIEW

## 2017-12-12 LAB — INR: Lab: 1.02

## 2017-12-12 LAB — SODIUM: Sodium:SCnc:Pt:Ser/Plas:Qn:: 141

## 2017-12-12 LAB — ALBUMIN: Albumin:MCnc:Pt:Ser/Plas:Qn:: 4.6

## 2017-12-12 LAB — AFP-TUMOR MARKER: Alpha-1-Fetoprotein.tumor marker:MCnc:Pt:Ser/Plas:Qn:: 10.4 — ABNORMAL HIGH

## 2017-12-12 LAB — HEPATITIS A IGG: Hepatitis A virus Ab.IgG:PrThr:Pt:Ser:Ord:: REACTIVE — AB

## 2017-12-12 NOTE — Unmapped (Signed)
Patient c/o pain in left shoulder,right knee,nausea and headache.    Patient reports that she took ibuprofen last night for the headache and reports that she has broken out in a rash.

## 2017-12-12 NOTE — Unmapped (Signed)
[facility], [facility], [address]  [place], [place]  [address]  Ph: [phone]  Fax: [phone]    12/12/2017    Patient Care Team:  Holland Commons, MD as PCP - General (Internal Medicine)  Jackie Plum, MD as Referring Physician (Internal Medicine)  Esperanza Heir, MD as Consulting Physician (Gastroenterology)    RE: Belinda Kidd; DOB: Jun 09, 1961      Reason for visit: Follow-up for chronic Hep C, genotype 1a currently on treatment with Mavyret week #5/8    HPI: Belinda Kidd is a 57 year old African-American female with a history of genotype 1 a chronic hepatitis C with a previous liver biopsy on 10/03/2002 in [place] that showed diffuse steatosis with minimal chronic inflammatory activity and essentially no abnormal fibrosis.  Per previous note she was treated with pegylated interferon and ribavirin for at least 2 months in around 2004 with treatment stopping on 06/05/2003.  She claimed treatment was stopped because of thrombocytopenia.  He has been previously followed by Dr. Jacqualine Mau here at [facility] since 2010.  He last saw the patient in 2016 at that time was considering treatment with the newer DAA's.  Unfortunately patient did not have significant fibrosis on any of her testing to meet the criteria for Medicaid for treatment.  She did undergo a elastography in 2016 but those results are not available for review.  I last saw her patient in hepatology clinic on 07/25/2017.  At that time we discussed proceeding with treatment for her chronic hepatitis C.  In the interim she did undergo a liver elastography on 10/03/2017 locally.  We were able to review the report in care everywhere which showed F2 and some F3 disease.  Upon further review of the report in the text it is noted that the liver has some nodularity and some concern for cirrhosis.  As previously noted she is genotype 1a with a viral load of 800,000 IU/mL.  She initiated treatment with Mavyret on 11/10/2017 and arrives today week #5 visit.  Initial plan was to treat for 8 weeks.  However given review of her recent liver ultrasound we will extend to 12 weeks.  He denies any complications.  She is overall tolerating therapy well.  She did have a headache in the last day or 2 but otherwise has no significant complaints today in clinic.  She is immune to hepatitis B and her immunity hepatitis A is unknown.  She has no history of hypertension diabetes or coronary disease.    Today in clinic he overall feels well denies any fever, chills, headache, jaundice, chest pain, upper or lower GI bleeding, melena confusion.        PMH:  Patient Active Problem List   Diagnosis   ??? Osteoarthritis of cervical spine   ??? Chronic hepatitis C (CMS-HCC)   ??? Chronic pain     No past medical history on file.    PSH:  Past Surgical History:   Procedure Laterality Date   ??? NO PAST SURGERIES         MEDICATIONS:  Current Outpatient Medications   Medication Sig Dispense Refill   ??? glecaprevir-pibrentasvir (MAVYRET) 100-40 mg tablet Take 3 tablets by mouth daily. Take with food. 1 4-week carton 1   ??? estradiol (ESTRACE) 1 MG tablet   1   ??? oxyCODONE (ROXICODONE) 10 mg immediate release tablet   0   ??? promethazine (PHENERGAN) 25 MG tablet 3 mg.     ???  tiZANidine (ZANAFLEX) 2 MG tablet   0     No current facility-administered medications for this visit.        ALLERGIES:  Acetaminophen      SH: She is single and has 5 children.  She does smoke half pack cigarettes a day.  She does not use alcohol.      RoS: Negative on 10 systems review other than what is mentioned above.    PE: Blood pressure 134/67, pulse 70, temperature 36.2 ??C (97.2 ??F), temperature source Tympanic, height 177.8 cm (5' 10), weight 85.9 kg (189 lb 4.8 oz), SpO2 100 %. Body mass index is 27.16 kg/m??.;  WDWN, no acute distress; No scleral icterus; no xanthelasma; oropharynx clear; neck supple without lymphadenopathy, carotid bruits or jugular venous distention; Lungs were clear to the bases by auscultation and percussion; Heart: regular rate and rhythm, normal S1, S2 without significant murmur or gallop.  Abdomen: soft, non-tender, non-distended; no ascites; no hepatosplenomegaly; no rubs, bruits or masses.  Extremities: no clubbing, edema or palmer erythema.  Skin: no spider angiomata, no rash; Neuropsych: normal affect, speech pattern and cognitive function; oriented x 3; no asterixis.    LABS:  MELD-Na score: 6 at 12/12/2017 10:09 AM  MELD score: 6 at 12/12/2017 10:09 AM  Calculated from:  Serum Creatinine: 0.80 mg/dL (Rounded to 1 mg/dL) at 1/61/0960 45:40 AM  Serum Sodium: 141 mmol/L (Rounded to 137 mmol/L) at 12/12/2017 10:09 AM  Total Bilirubin: 1.0 mg/dL at 9/81/1914 78:29 AM  INR(ratio): 1.02 at 12/12/2017 10:09 AM  Age: 43 years    All lab results last 72 hours:    Recent Results (from the past 72 hour(s))   AFP tumor marker    Collection Time: 12/12/17 10:09 AM   Result Value Ref Range    AFP-Tumor Marker 10.40 (H) <7.51 ng/mL   PT-INR    Collection Time: 12/12/17 10:09 AM   Result Value Ref Range    PT 11.6 10.2 - 12.8 sec    INR 1.02    Creatinine, serum    Collection Time: 12/12/17 10:09 AM   Result Value Ref Range    Creatinine 0.80 0.60 - 1.00 mg/dL    EGFR MDRD Af Amer >=56 >=60 mL/min/1.88m2    EGFR MDRD Non Af Amer >=60 >=60 mL/min/1.105m2   Sodium    Collection Time: 12/12/17 10:09 AM   Result Value Ref Range    Sodium 141 135 - 145 mmol/L   Hepatic Function Panel (LFTs)    Collection Time: 12/12/17 10:09 AM   Result Value Ref Range    Albumin 4.6 3.5 - 5.0 g/dL    Total Protein 8.5 (H) 6.5 - 8.3 g/dL    Total Bilirubin 1.0 0.0 - 1.2 mg/dL    Bilirubin, Direct 2.13 0.00 - 0.40 mg/dL    AST 24 14 - 38 U/L    ALT 15 15 - 48 U/L    Alkaline Phosphatase 64 38 - 126 U/L   CBC w/ Differential    Collection Time: 12/12/17 10:09 AM   Result Value Ref Range    WBC 3.9 (L) 4.5 - 11.0 10*9/L    RBC 5.39 (H) 4.00 - 5.20 10*12/L    HGB 13.5 12.0 - 16.0 g/dL    HCT 08.6 57.8 - 46.9 %    MCV 82.1 80.0 - 100.0 fL    MCH 25.1 (L) 26.0 - 34.0 pg    MCHC 30.6 (L) 31.0 - 37.0 g/dL    RDW 14.0  12.0 - 15.0 %    MPV 8.6 7.0 - 10.0 fL    Platelet 194 150 - 440 10*9/L    Neutrophils % 40.1 %    Lymphocytes % 48.9 %    Monocytes % 6.5 %    Eosinophils % 1.4 %    Basophils % 0.7 %    Absolute Neutrophils 1.6 (L) 2.0 - 7.5 10*9/L    Absolute Lymphocytes 1.9 1.5 - 5.0 10*9/L    Absolute Monocytes 0.3 0.2 - 0.8 10*9/L    Absolute Eosinophils 0.1 0.0 - 0.4 10*9/L    Absolute Basophils 0.0 0.0 - 0.1 10*9/L    Large Unstained Cells 2 0 - 4 %    Hypochromasia Marked (A) Not Present         ASSESSMENT:  Belinda Kidd  is a 57 year old African-American female with a history of genotype 1 a chronic hepatitis C with a previous liver biopsy on 10/03/2002 in [place] that showed diffuse steatosis with minimal chronic inflammatory activity and essentially no abnormal fibrosis.  Per previous note she was treated with pegylated interferon and ribavirin for at least 2 months in around 2004 with treatment stopping on 06/05/2003.  She claimed treatment was stopped because of thrombocytopenia.  He has been previously followed by Dr. Azucena Kuba here at [facility] in [place] since 2010.  He last saw the patient in 2016 at that time was considering treatment with the newer DAA's.  Unfortunately patient did not have significant fibrosis on any of her testing to meet the criteria for Medicaid for treatment.  Did undergo a elastography in 2016 but those results are not available for review.  As previously noted she is genotype 1a with a viral load of 800,000 IU/mL.  She initiated treatment with Mavyret on 11/10/2017 and arrives a week #5 visit.  Initial plan was to treat for 8 weeks however given that her recent abdominal ultrasound shows some nodularity and concern for cirrhosis we will extend her treatment to 12 weeks.  She is overall tolerating therapy well with only some mild headaches.  She denies any new medications or hospitalizations.  She has no history of hypertension, diabetes or coronary disease.  She is immune to hepatitis B and her immune to hepatitis A is unknown.        PLAN:  1.   I have seen and examined this patient today in clinic and discussed her care with Dr. Pervis Hocking.  2.  Check labs including HCV RNA  3.  Continue Mavyret as directed.  We will see about extending her treatment to 12 weeks.  4.  Repeat abdominal ultrasound in 03/2018  5.  Plan to se Belinda Kidd back to liver clinic in 7 weeks which should be her end of treatment visit.      Tina Griffiths, PA-C  [facility]  [address]  [place], [place]  [address]  Ph: [phone]  Fax: [phone]

## 2017-12-13 LAB — HEPATITIS C RNA, QUANTITATIVE, PCR: HCV RNA(IU): 922 [IU]/mL — ABNORMAL HIGH (ref <=0)

## 2017-12-13 LAB — HCV RNA LOG10: Lab: 2.96 — ABNORMAL HIGH

## 2017-12-14 MED ORDER — GLECAPREVIR 100 MG-PIBRENTASVIR 40 MG TABLET
ORAL_TABLET | Freq: Every day | ORAL | 0 refills | 0.00000 days | Status: CP
Start: 2017-12-14 — End: 2018-04-19

## 2017-12-14 MED ORDER — GLECAPREVIR 100 MG-PIBRENTASVIR 40 MG TABLET: 3 | tablet | 0 refills | 0 days

## 2017-12-14 NOTE — Unmapped (Signed)
Per Georgina Quint, PA, extending HCV treatment to 12 weeks from 8 weeks. Rx for additional 4 weeks sent to [facility]. Will need to get continuation PA approved by Medicaid.

## 2017-12-14 NOTE — Unmapped (Signed)
Belinda Kidd [facility] Specialty Medication Referral: PA APPROVED    Medication (Brand/Generic): Mavyret    Initial FSI Test Claim completed with resulted information below:  No PA required  Patient ABLE to fill at [facility] Company:  [facility]  Anticipated Copay: $3.00  Is anticipated copay with a copay card or grant? No    As Co-pay is under $100 defined limit, per policy there will be no further investigation of need for financial assistance at this time unless patient requests. This referral has been communicated to the provider and handed off to the [facility] team for further processing and filling of prescribed medication.   ______________________________________________________________________  Please utilize this referral for viewing purposes as it will serve as the central location for all relevant documentation and updates.

## 2017-12-18 NOTE — Unmapped (Signed)
Spoke with patient and instructed her to get labs done at end of TW # 8 and TW # 12. Also to get labs repeated at 12 wk post. Will mail a copy of lab orders to her to be resulted at [facility].    Patient requesting nausea medication refill for promethazine. Informed her I will ask her provider.   Patient verbalized understanding.

## 2017-12-18 NOTE — Unmapped (Signed)
Follow-Up Counseling for HCV Treatment      Regimen: Mavyret (glecaprevir/pibrentasvir 100/40 mg)  x 12 weeks  (originally planned for 8 weeks, extended to 12 weeks)  Start Date: 11/10/17  Completed Treatment Week #5    Pharmacy: [facility] [phone] option #4      Following topics were reviewed during the phone call:     Patient Counseling    Counseled the patient on the following:  doses and administration discussed, possible adverse effects and management discussed, possible drug and prescription drug interactions discussed, possible drug and OTC drug and food interactions discussed, lab monitoring and follow-up discussed, therapeutic rationale discussed, adherence and missed doses discussed, pharmacy contact information discussed       1. Medication administration - Takes Mavyret 3 tabs daily with food.    2. Importance of adherence -   Medication Adherence    Patient reported X missed doses in the last month:  0  Specialty Medication:  mavyret  Patient is on additional specialty medications:  No  Patient is on more than two specialty medications:  No  Any gaps in refill history greater than 2 weeks in the last 3 months:  no  Demonstrates understanding of importance of adherence:  yes  Informant:  patient  Reliability of informant:  reliable  Provider-estimated medication adherence level:  90-100%  Patient is at risk for Non-Adherence:  No  Reasons for non-adherence:  no problems identified   Other adherence tool:  routine   Support network for adherence:  family member, healthcare provider  Confirmed plan for next specialty medication refill:  delivery by pharmacy     Pill count over the phone revealed #16 day supply which is appropriate.    3. Side effects - No side effects were reported.    4. Drug-drug interaction - Denies any new medications  Drug Interactions    Drug interactions evaluated:  no  Clinically relevant drug interactions identified:  no         5. Follow up - Has follow up appointment scheduled in HCV treatment clinic on 01/30/18 at 0900 with Georgina Quint, PA.  Reminded patient to have labs drawn locally at end of week 8 (when she finishes the supply she currently has on hand). Lab orders are being to sent to [facility] and to patient.     Refill Coordination    Has the Patients' Contact Information Changed:  No  Is the Shipping Address Different:  No       Shipping Information    Delivery Scheduled:  Yes  Delivery Date:  12/26/17         Marijean Heath, PharmD Candidate  Park Breed, Pharm D., BCPS, BCGP, CPP  [facility]  [address]  [place], [place] [address]  [phone]

## 2017-12-18 NOTE — Unmapped (Signed)
-----   Message from [place], [place] sent at 12/13/2017  2:49 PM EDT -----  Week 5/8 HCV RNA. Let's go to 12 and get repeat HCV at week 8.    Thanks  Acupuncturist    ----- Message -----  From: Background User Lab  Sent: 12/12/2017  10:56 AM  To: Connye Burkitt, PA

## 2017-12-19 MED ORDER — PROMETHAZINE 25 MG TABLET
ORAL_TABLET | Freq: Two times a day (BID) | ORAL | 2 refills | 0 days | Status: CP | PRN
Start: 2017-12-19 — End: 2018-03-13

## 2017-12-25 MED FILL — MAVYRET/100-40MG/TABS: MAVYRET/100-40MG/TABS | 28 days supply | Qty: 84 | Fill #0

## 2018-01-04 LAB — TEST INFORMATION:

## 2018-01-08 NOTE — Unmapped (Signed)
Spoke with patient and reviewed lab results. HCV RNA Qn <15 (prior to start of treatment VL 5.52mil)    Instructed patient with regard to medication: Continue with Harvoni daily as prescribed    Instructed patient with regard to follow-up lab: when all tablets of the prescription have been taken     Answered questions about the results.   Patient / family member verbalized understanding

## 2018-01-18 NOTE — Unmapped (Signed)
Patient called to get a copy of her lab orders mailed to her for EOT labs. Printed and mailed.

## 2018-01-23 NOTE — Unmapped (Signed)
Follow-Up Counseling for HCV Treatment      Regimen: Mavyret (glecaprevir/pibrentasvir 100/40 mg)  x 12 weeks  Start Date: 11/10/17  Completed Treatment Week #10    Pharmacy: [facility] [phone]     Following topics were reviewed during the phone call:  Belinda Kidd Counseling    Counseled the Belinda Kidd on the following:  doses and administration discussed, possible adverse effects and management discussed, possible drug and prescription drug interactions discussed, possible drug and OTC drug and food interactions discussed, lab monitoring and follow-up discussed, therapeutic rationale discussed, adherence and missed doses discussed, pharmacy contact information discussed         1. Medication administration - Takes Mavyret 3 tabs between 11 & 12 with lunch.     2. Importance of adherence -   Medication Adherence    Belinda Kidd reported X missed doses in the last month:  0  Specialty Medication:  Mavyret  Belinda Kidd is on additional specialty medications:  No  Belinda Kidd is on more than two specialty medications:  No  Any gaps in refill history greater than 2 weeks in the last 3 months:  no  Demonstrates understanding of importance of adherence:  yes  Informant:  Belinda Kidd  Reliability of informant:  reliable  Provider-estimated medication adherence level:  90-100%  Belinda Kidd is at risk for Non-Adherence:  No  Reasons for non-adherence:  no problems identified  Adherence tools used:  directed education   Other adherence tool:  routine   Support network for adherence:  family member, healthcare provider       Pill count over the phone revealed #9 day supply which is appropriate.    3. Side effects - Pt denies any side effects and reports her headaches are gone.   Adverse Effects    *All other systems reviewed and are negative         4. Drug-drug interaction - Pt denies starting any new medications. Pt denies any alcohol use while on tx.   Drug Interactions    Drug interactions evaluated:  yes  Clinically relevant drug interactions identified:  no         5. Follow up - Has follow up appointment scheduled in HCV treatment clinic on 01/30/18 @ 0900 with Tina Griffiths, PA . Pt advised her tx will be complete when she finished the remaining 9 packets of Mavyret. Informed Belinda Kidd that there is still a small chance of relapse after finishing the treatment. Stressed importance of follow up 3 months post treatment to assess for cure.      All questions were answered.      Vertell Limber RN, [facility]   Pharmacy Adult GI Medicine  [facility]  [address]   [place], [place] [address]  [phone]    January 23, 2018 2:26 PM

## 2018-03-13 MED ORDER — PROMETHAZINE 25 MG TABLET
ORAL_TABLET | Freq: Two times a day (BID) | ORAL | 2 refills | 0 days | Status: CP | PRN
Start: 2018-03-13 — End: 2018-05-18

## 2018-05-18 MED ORDER — PROMETHAZINE 25 MG TABLET
ORAL_TABLET | Freq: Two times a day (BID) | ORAL | 2 refills | 0.00000 days | Status: CP | PRN
Start: 2018-05-18 — End: 2018-09-13

## 2018-09-13 MED ORDER — PROMETHAZINE 25 MG TABLET
ORAL_TABLET | Freq: Two times a day (BID) | ORAL | 0 refills | 0 days | Status: CP | PRN
Start: 2018-09-13 — End: 2019-01-09

## 2019-01-09 MED ORDER — PROMETHAZINE 25 MG TABLET
ORAL_TABLET | 0 refills | 0 days | Status: CP
Start: 2019-01-09 — End: ?

## 2019-05-13 DIAGNOSIS — R1011 Right upper quadrant pain: Secondary | ICD-10-CM

## 2019-05-13 DIAGNOSIS — R110 Nausea: Secondary | ICD-10-CM

## 2019-05-13 MED ORDER — PROMETHAZINE 25 MG TABLET
ORAL_TABLET | Freq: Three times a day (TID) | ORAL | 0 refills | 7.00000 days | Status: CP | PRN
Start: 2019-05-13 — End: ?

## 2019-06-04 ENCOUNTER — Encounter
Admit: 2019-06-04 | Discharge: 2019-06-05 | Payer: PRIVATE HEALTH INSURANCE | Attending: Gastroenterology | Primary: Gastroenterology

## 2019-06-04 DIAGNOSIS — B182 Chronic viral hepatitis C: Principal | ICD-10-CM

## 2019-06-20 DIAGNOSIS — Z129 Encounter for screening for malignant neoplasm, site unspecified: Principal | ICD-10-CM

## 2019-06-20 DIAGNOSIS — B182 Chronic viral hepatitis C: Principal | ICD-10-CM

## 2019-06-21 DIAGNOSIS — R110 Nausea: Principal | ICD-10-CM

## 2019-06-21 DIAGNOSIS — R1011 Right upper quadrant pain: Principal | ICD-10-CM

## 2019-06-21 MED ORDER — PROMETHAZINE 25 MG TABLET
ORAL_TABLET | Freq: Three times a day (TID) | ORAL | 0 refills | 7.00000 days | Status: CP | PRN
Start: 2019-06-21 — End: ?

## 2019-07-05 ENCOUNTER — Other Ambulatory Visit: Payer: Self-pay | Admitting: Gastroenterology

## 2019-07-05 DIAGNOSIS — E7889 Other lipoprotein metabolism disorders: Secondary | ICD-10-CM

## 2019-07-05 DIAGNOSIS — E8889 Other specified metabolic disorders: Secondary | ICD-10-CM

## 2019-07-11 ENCOUNTER — Ambulatory Visit
Admission: RE | Admit: 2019-07-11 | Discharge: 2019-07-11 | Disposition: A | Discharge status: Home / Self Care | Payer: Medicaid Other | Source: Ambulatory Visit | Attending: Gastroenterology | Admitting: Gastroenterology

## 2019-07-11 DIAGNOSIS — E8889 Other specified metabolic disorders: Secondary | ICD-10-CM

## 2019-07-11 DIAGNOSIS — E7889 Other lipoprotein metabolism disorders: Secondary | ICD-10-CM

## 2020-01-13 MED ORDER — TIZANIDINE 4 MG TABLET
Freq: Four times a day (QID) | ORAL | 0 days | PRN
Start: 2020-01-13 — End: 2020-03-05

## 2020-01-13 MED ORDER — SUBOXONE 2 MG-0.5 MG SUBLINGUAL FILM
0 days
Start: 2020-01-13 — End: ?

## 2020-02-19 MED ORDER — XARELTO 15 MG TABLET
0 days
Start: 2020-02-19 — End: ?

## 2020-02-27 MED ORDER — GABAPENTIN 300 MG CAPSULE
0 days
Start: 2020-02-27 — End: ?

## 2020-03-02 ENCOUNTER — Ambulatory Visit: Admit: 2020-03-02 | Discharge: 2020-03-03 | Payer: PRIVATE HEALTH INSURANCE

## 2020-03-02 DIAGNOSIS — B182 Chronic viral hepatitis C: Principal | ICD-10-CM

## 2020-05-19 DIAGNOSIS — R110 Nausea: Principal | ICD-10-CM

## 2020-05-19 DIAGNOSIS — K74 Liver fibrosis: Principal | ICD-10-CM

## 2020-05-19 DIAGNOSIS — R1011 Right upper quadrant pain: Principal | ICD-10-CM

## 2020-08-05 DIAGNOSIS — B182 Chronic viral hepatitis C: Principal | ICD-10-CM

## 2020-08-05 DIAGNOSIS — Z1289 Encounter for screening for malignant neoplasm of other sites: Principal | ICD-10-CM

## 2020-08-06 ENCOUNTER — Other Ambulatory Visit: Payer: Self-pay | Admitting: Transplant Hepatology

## 2020-08-06 DIAGNOSIS — K7460 Unspecified cirrhosis of liver: Secondary | ICD-10-CM

## 2020-08-10 DIAGNOSIS — K74 Liver fibrosis: Principal | ICD-10-CM

## 2020-08-10 DIAGNOSIS — R110 Nausea: Principal | ICD-10-CM

## 2020-08-10 DIAGNOSIS — R1011 Right upper quadrant pain: Principal | ICD-10-CM

## 2020-08-28 ENCOUNTER — Other Ambulatory Visit: Payer: Medicaid Other

## 2020-09-08 ENCOUNTER — Other Ambulatory Visit: Payer: Medicaid Other

## 2020-10-27 ENCOUNTER — Other Ambulatory Visit: Payer: Medicaid Other

## 2020-11-02 DIAGNOSIS — R110 Nausea: Principal | ICD-10-CM

## 2020-11-02 DIAGNOSIS — R1011 Right upper quadrant pain: Principal | ICD-10-CM

## 2020-11-02 DIAGNOSIS — K74 Liver fibrosis: Principal | ICD-10-CM

## 2020-11-10 ENCOUNTER — Ambulatory Visit
Admission: RE | Admit: 2020-11-10 | Discharge: 2020-11-10 | Disposition: A | Discharge status: Home / Self Care | Payer: Medicaid Other | Source: Ambulatory Visit | Attending: Transplant Hepatology | Admitting: Transplant Hepatology

## 2020-11-10 DIAGNOSIS — K7460 Unspecified cirrhosis of liver: Secondary | ICD-10-CM

## 2021-01-25 DIAGNOSIS — K74 Liver fibrosis: Principal | ICD-10-CM

## 2021-01-25 DIAGNOSIS — R110 Nausea: Principal | ICD-10-CM

## 2021-01-25 DIAGNOSIS — R1011 Right upper quadrant pain: Principal | ICD-10-CM

## 2021-02-11 ENCOUNTER — Ambulatory Visit: Admit: 2021-02-11 | Discharge: 2021-02-12 | Payer: PRIVATE HEALTH INSURANCE

## 2021-02-11 DIAGNOSIS — B182 Chronic viral hepatitis C: Principal | ICD-10-CM

## 2021-05-14 DIAGNOSIS — K74 Liver fibrosis: Principal | ICD-10-CM

## 2021-05-14 DIAGNOSIS — Z1289 Encounter for screening for malignant neoplasm of other sites: Principal | ICD-10-CM

## 2021-05-18 ENCOUNTER — Other Ambulatory Visit: Payer: Self-pay | Admitting: Transplant Hepatology

## 2021-05-18 DIAGNOSIS — Z1289 Encounter for screening for malignant neoplasm of other sites: Secondary | ICD-10-CM

## 2021-05-18 DIAGNOSIS — K7400 Hepatic fibrosis, unspecified: Secondary | ICD-10-CM

## 2021-05-26 ENCOUNTER — Ambulatory Visit
Admission: RE | Admit: 2021-05-26 | Discharge: 2021-05-26 | Disposition: A | Discharge status: Home / Self Care | Payer: Medicaid Other | Source: Ambulatory Visit | Attending: Transplant Hepatology | Admitting: Transplant Hepatology

## 2021-05-26 DIAGNOSIS — Z1289 Encounter for screening for malignant neoplasm of other sites: Secondary | ICD-10-CM

## 2021-05-26 DIAGNOSIS — K7400 Hepatic fibrosis, unspecified: Secondary | ICD-10-CM

## 2021-09-07 ENCOUNTER — Ambulatory Visit: Admit: 2021-09-07 | Discharge: 2021-09-08 | Payer: PRIVATE HEALTH INSURANCE

## 2021-09-07 DIAGNOSIS — B182 Chronic viral hepatitis C: Principal | ICD-10-CM

## 2021-09-07 DIAGNOSIS — K74 Liver fibrosis: Principal | ICD-10-CM

## 2021-09-07 DIAGNOSIS — Z8619 Personal history of other infectious and parasitic diseases: Principal | ICD-10-CM

## 2021-10-25 ENCOUNTER — Other Ambulatory Visit: Payer: Self-pay | Admitting: Adult Health Nurse Practitioner

## 2021-10-25 DIAGNOSIS — Z8619 Personal history of other infectious and parasitic diseases: Secondary | ICD-10-CM

## 2022-06-01 IMAGING — US US ABDOMEN COMPLETE
1 series · 14 of 25 positions shown · non-contrast
Comparison: 07/11/2019 and 10/03/2017

CLINICAL DATA: Cirrhosis.  Chronic hepatitis-C.

EXAM:
ABDOMEN ULTRASOUND COMPLETE

[Series 1: us abdomen complete · 0.14mm/px · 14 of 72 slices shown]
[im 1/72]
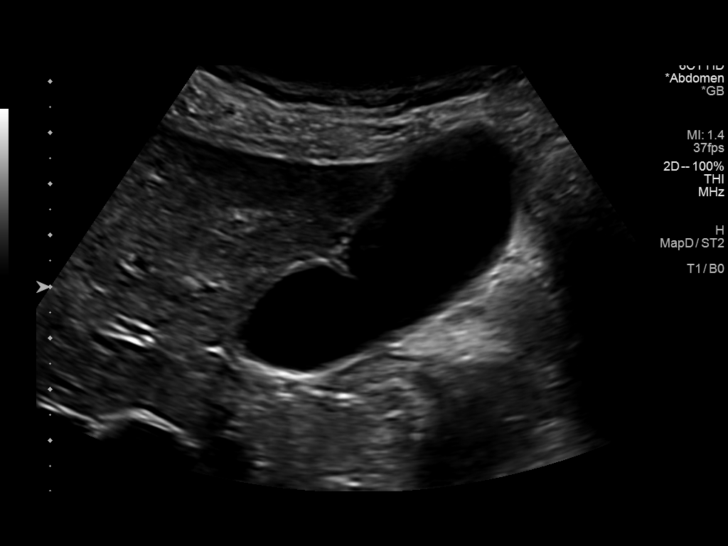
[im 6/72]
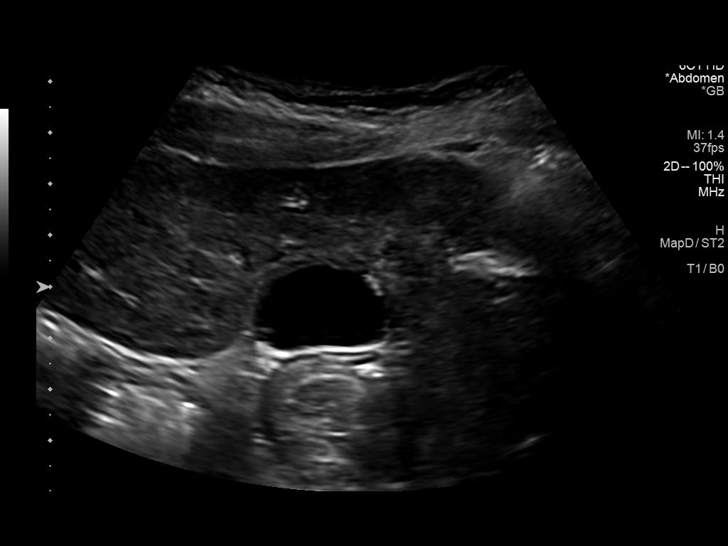
[im 12/72]
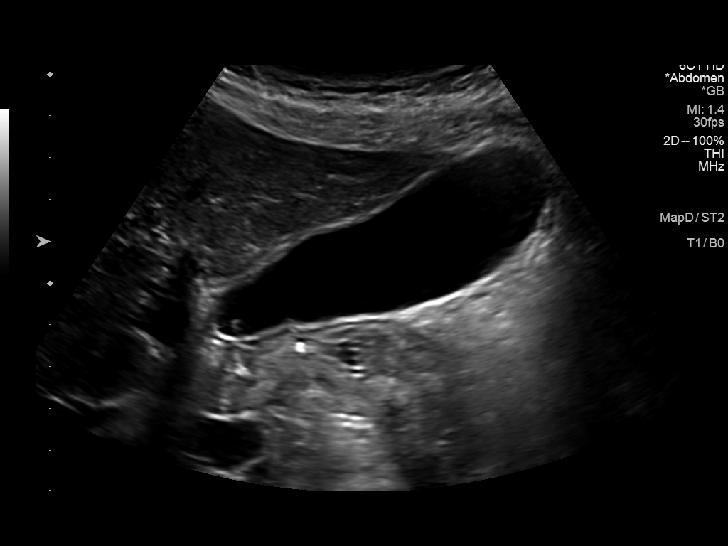
[im 18/72]
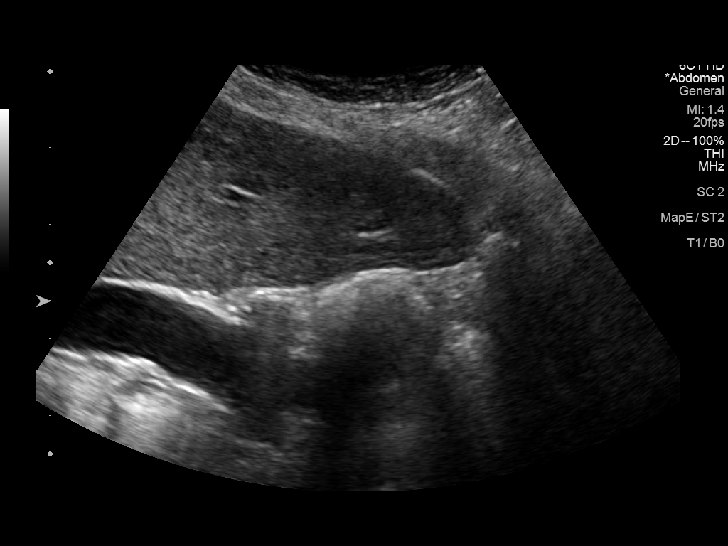
[im 24/72]
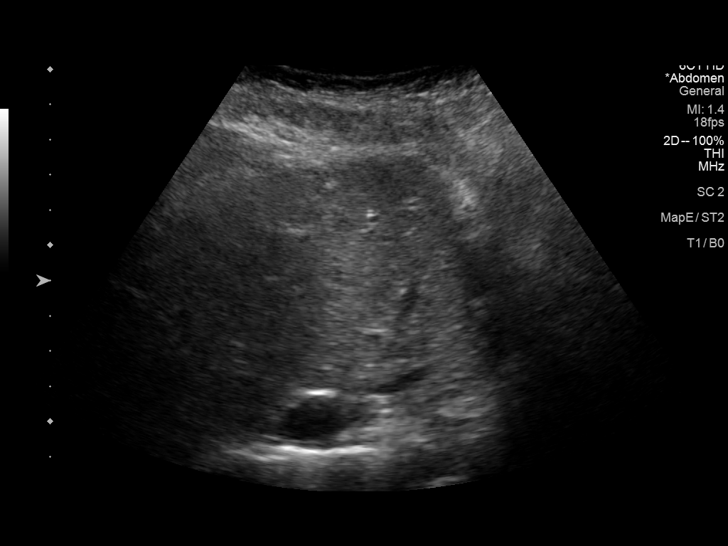
[im 27/72]
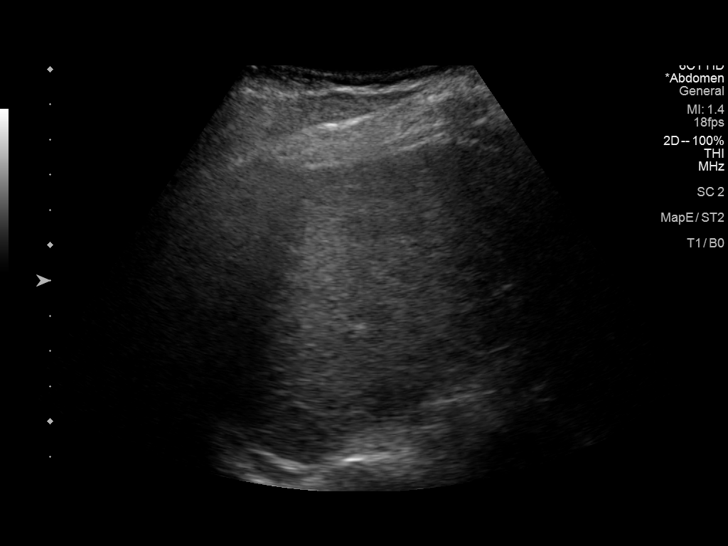
[im 33/72]
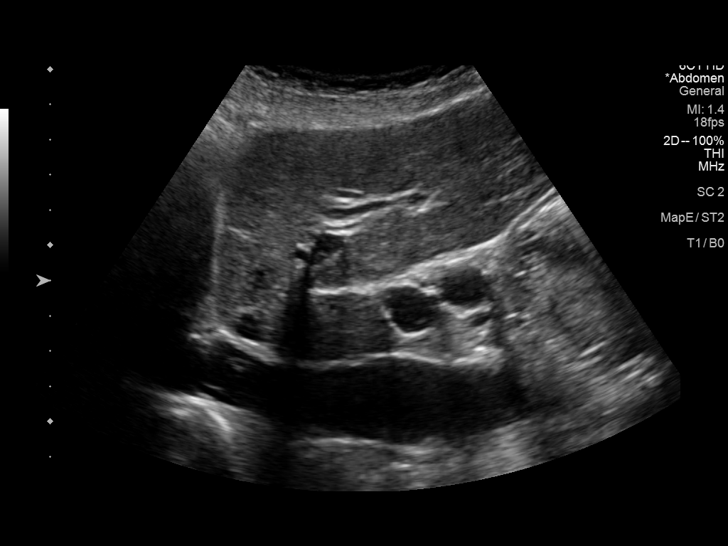
[im 39/72]
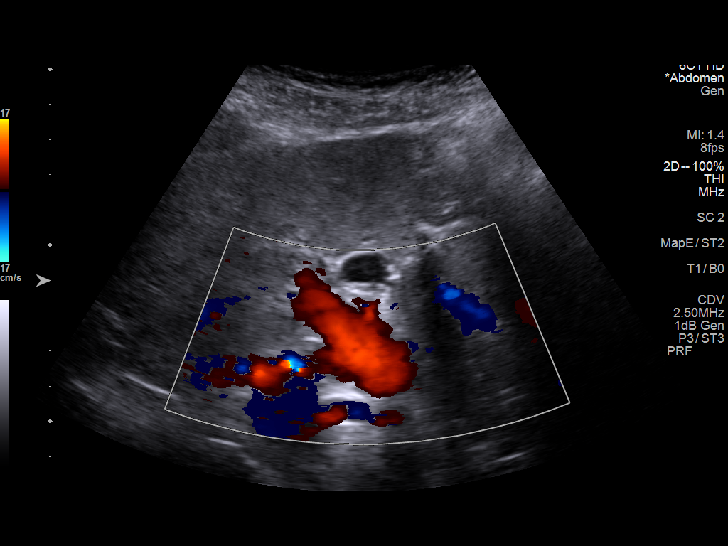
[im 45/72]
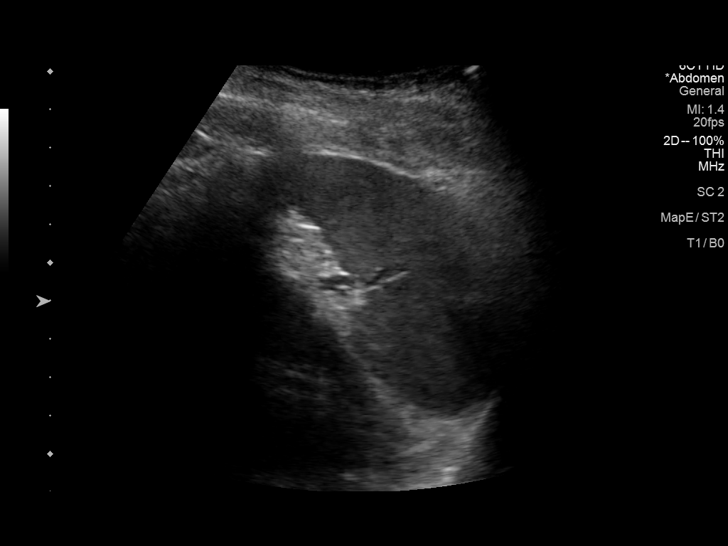
[im 48/72]
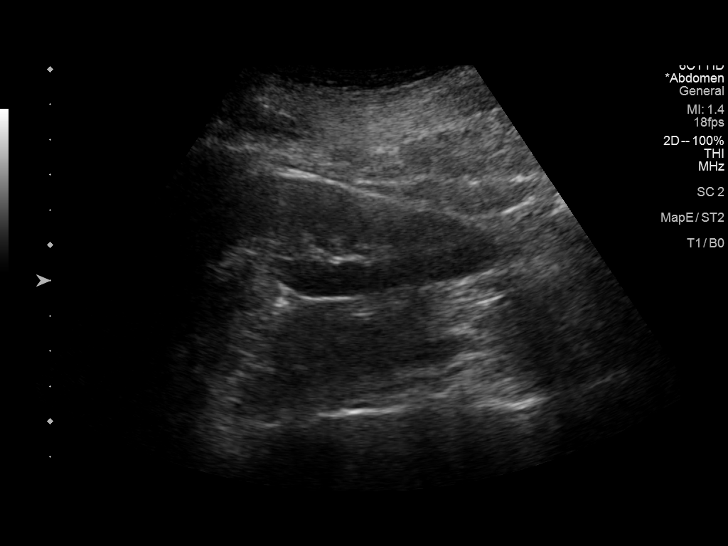
[im 54/72]
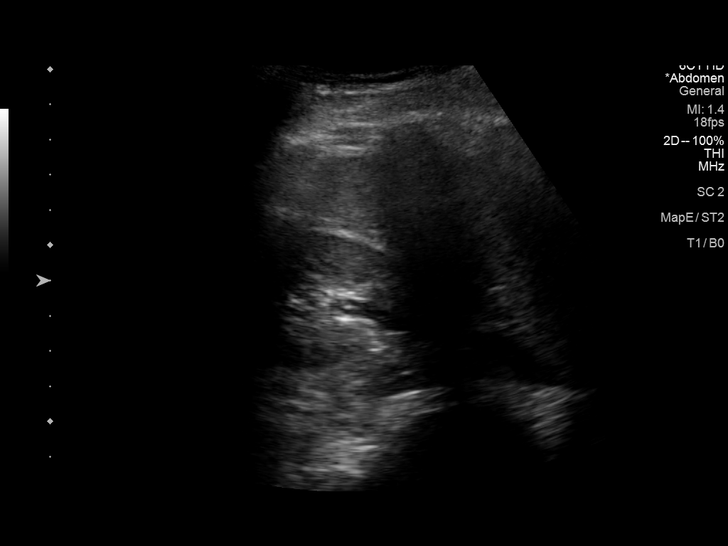
[im 60/72]
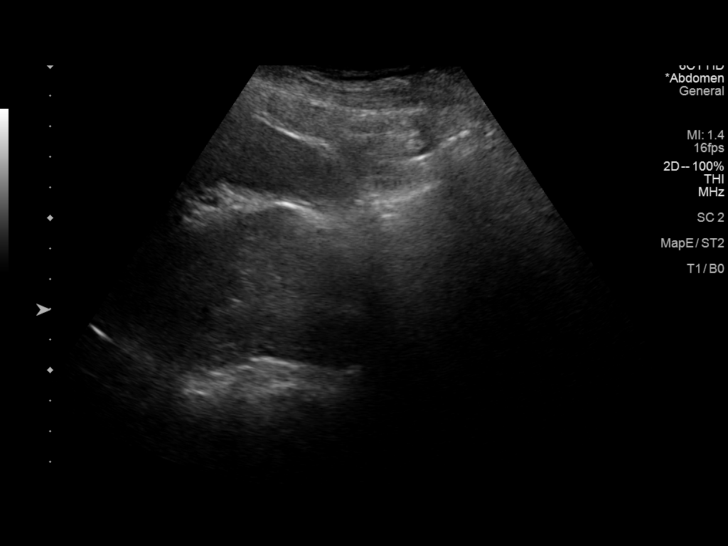
[im 66/72]
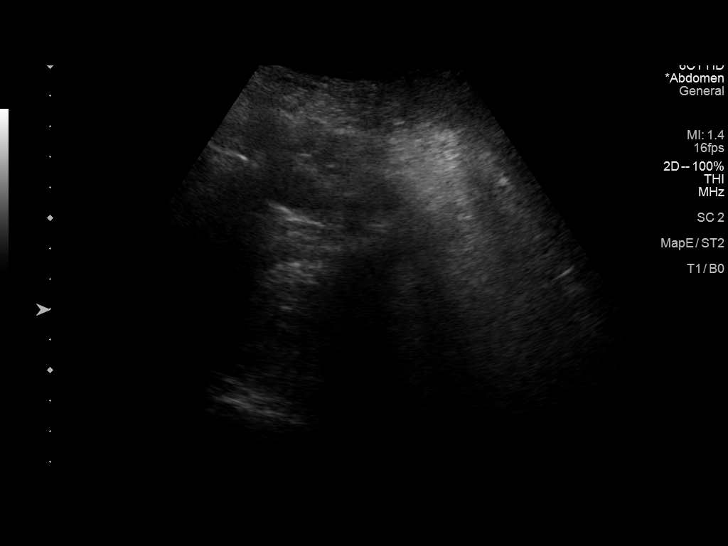
[im 72/72]
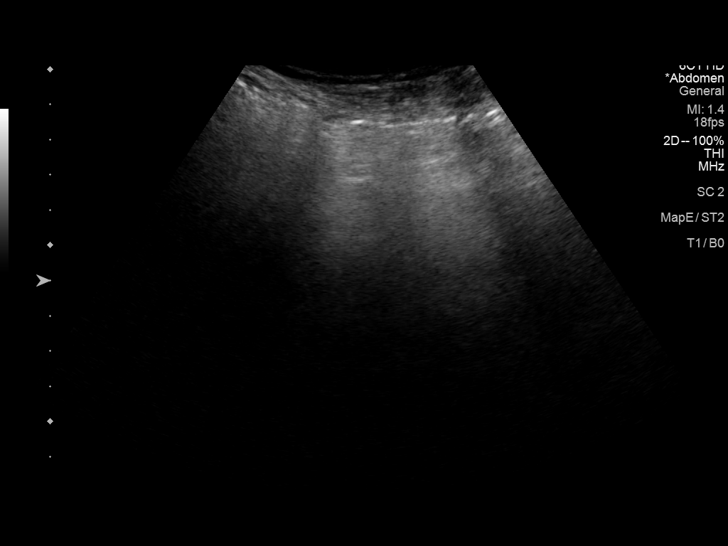

[14 of 25 positions shown; findings below may reference images not displayed]

FINDINGS: Gallbladder: No gallstones or wall thickening visualized. No
sonographic Murphy sign noted by sonographer.

Common bile duct: Diameter: 11 mm in maximum diameter, without
significant change compared to prior exams.

Liver: Diffuse coarsening of hepatic echotexture, consistent with
diffuse hepatocellular disease. No liver mass identified. Portal
vein is patent on color Doppler imaging with normal direction of
blood flow towards the liver.

IVC: No abnormality visualized.

Pancreas: Visualized portion unremarkable.

Spleen: Size and appearance within normal limits.

Right Kidney: Length: 10.7 cm. Echogenicity within normal limits. No
mass or hydronephrosis visualized.

Left Kidney: Length: 10.5 cm. Echogenicity within normal limits. No
mass or hydronephrosis visualized.

Abdominal aorta: No aneurysm visualized.

Other findings: None.
IMPRESSION: Diffuse hepatocellular disease. No hepatic mass identified.

Stable mild dilatation of common bile duct measuring 11 mm. No
evidence of cholelithiasis.

## 2022-11-03 DIAGNOSIS — B182 Chronic viral hepatitis C: Principal | ICD-10-CM

## 2022-12-15 IMAGING — US US ABDOMEN COMPLETE
1 series · 14 of 25 positions shown · non-contrast
Comparison: Abdominal ultrasound dated November 10, 2020.

CLINICAL DATA: Cirrhosis.

EXAM:
ABDOMEN ULTRASOUND COMPLETE

[Series 1: us abdomen complete · 0.19mm/px · 14 of 97 slices shown]
[im 1/97]
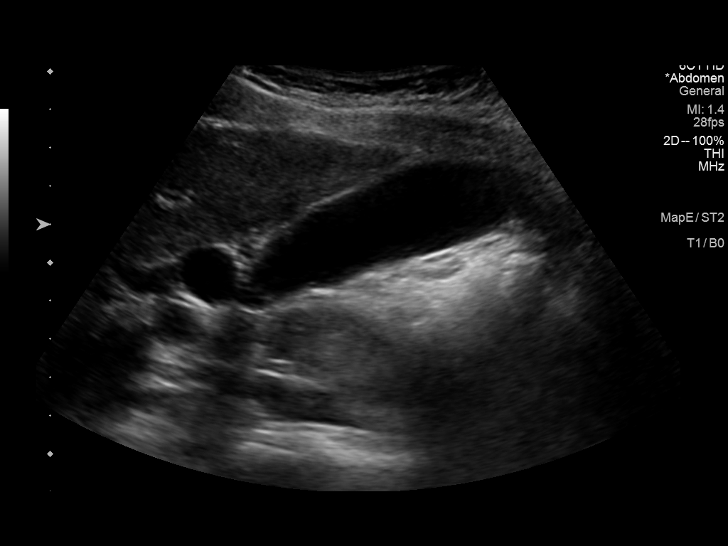
[im 9/97]
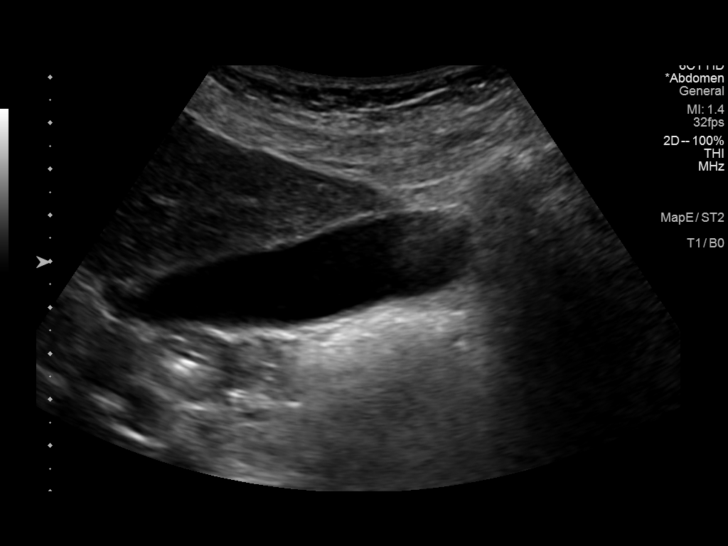
[im 17/97]
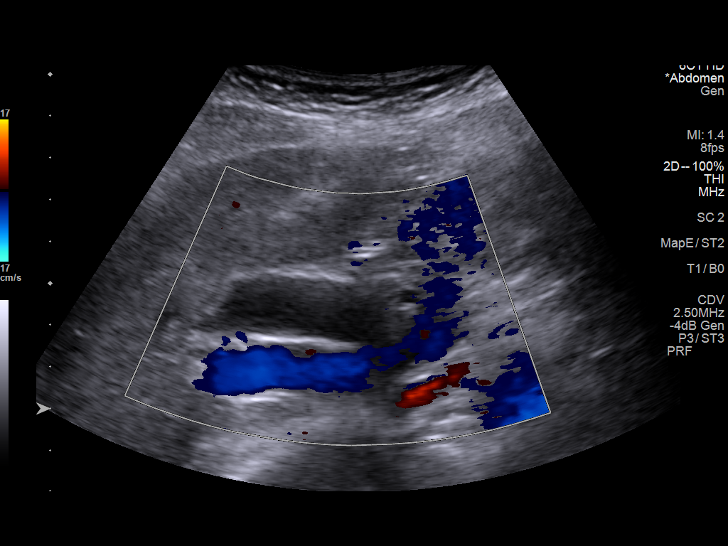
[im 25/97]
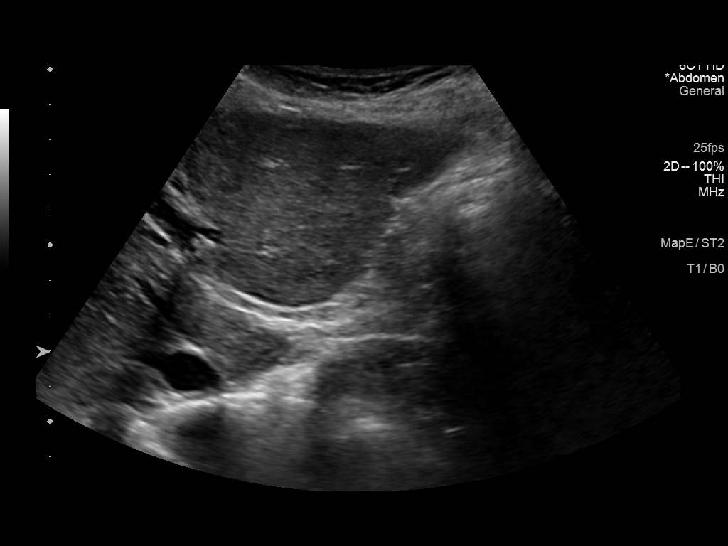
[im 33/97]
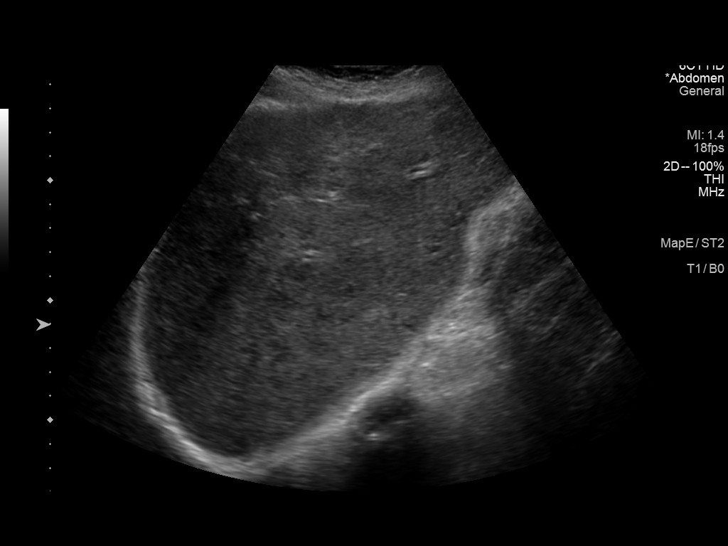
[im 37/97]
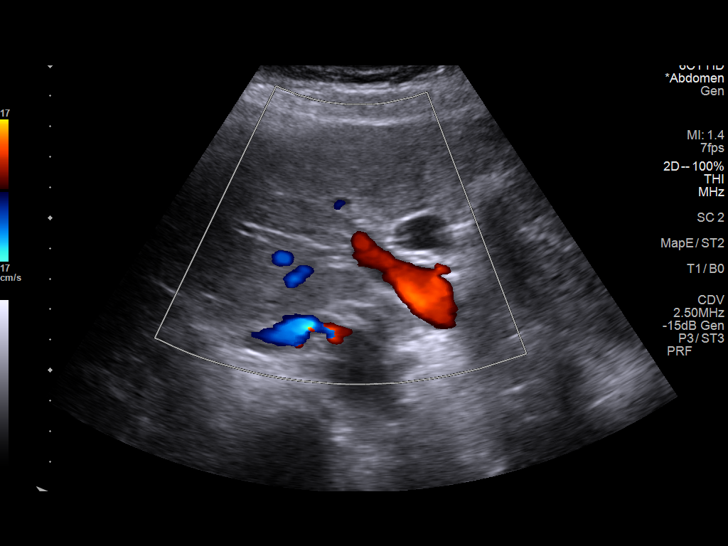
[im 45/97]
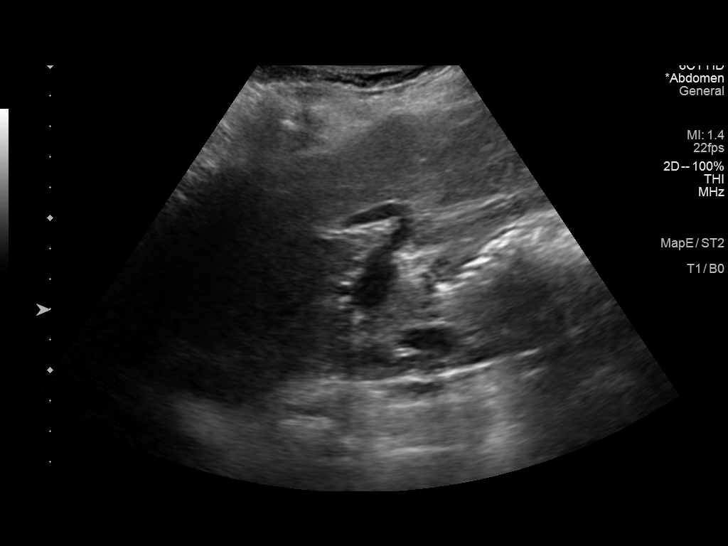
[im 53/97]
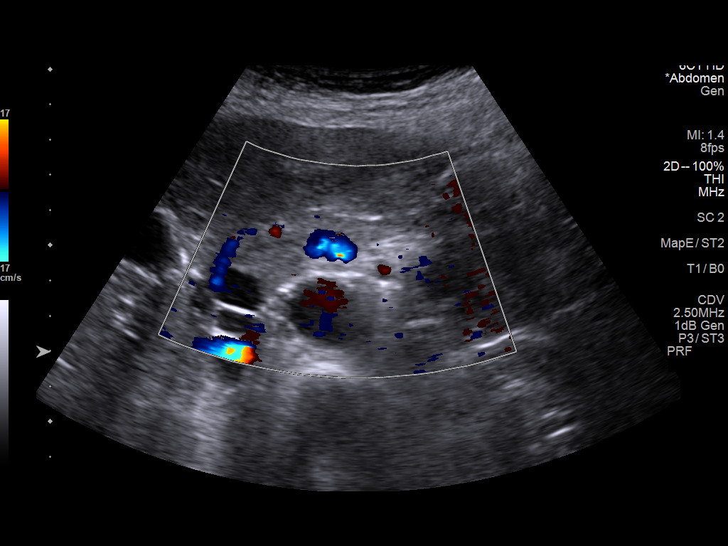
[im 61/97]
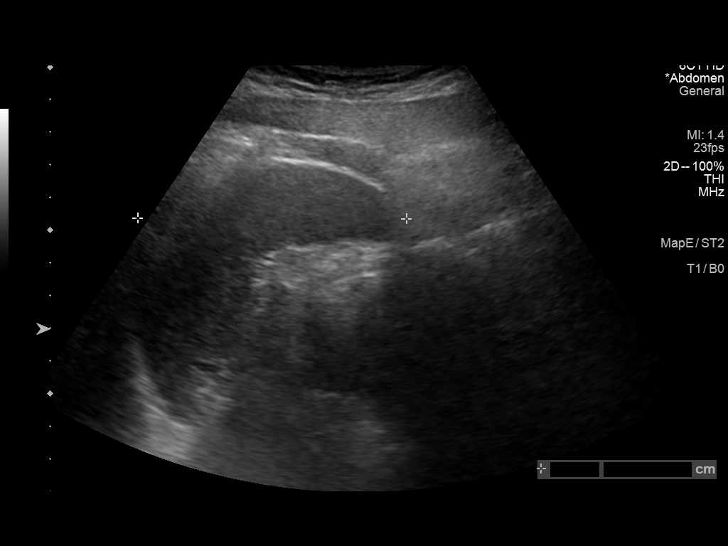
[im 65/97]
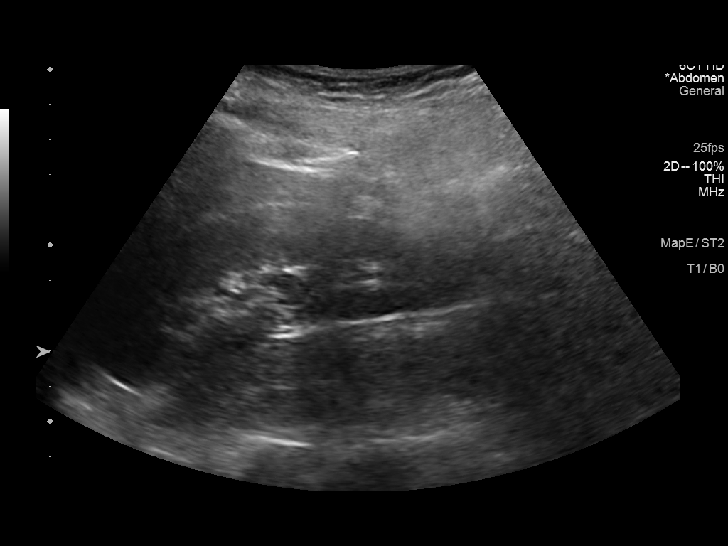
[im 73/97]
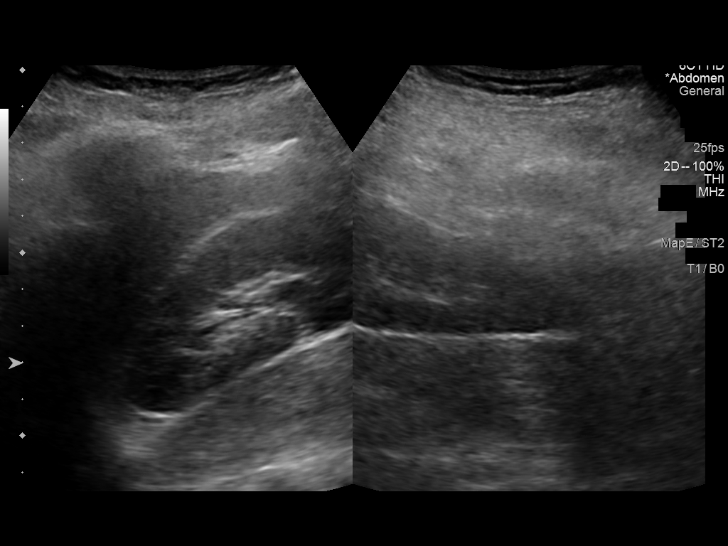
[im 81/97]
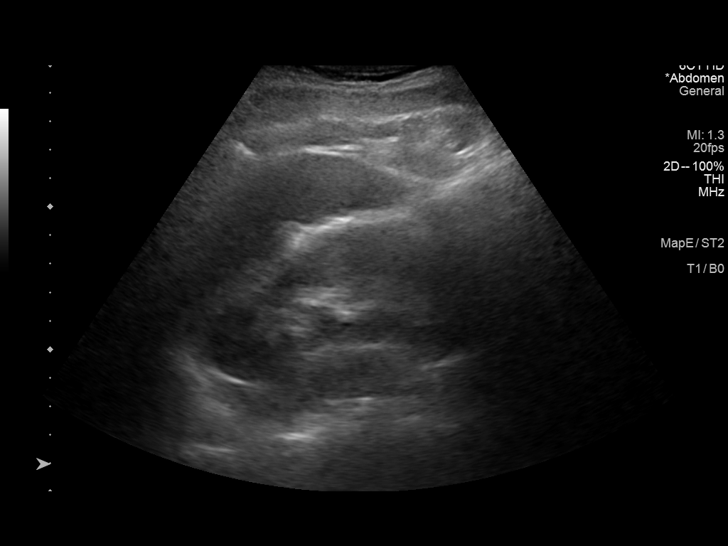
[im 89/97]
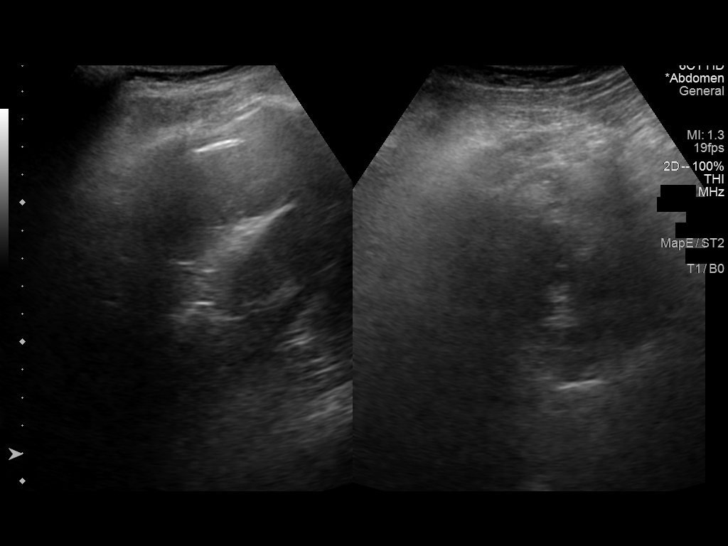
[im 97/97]
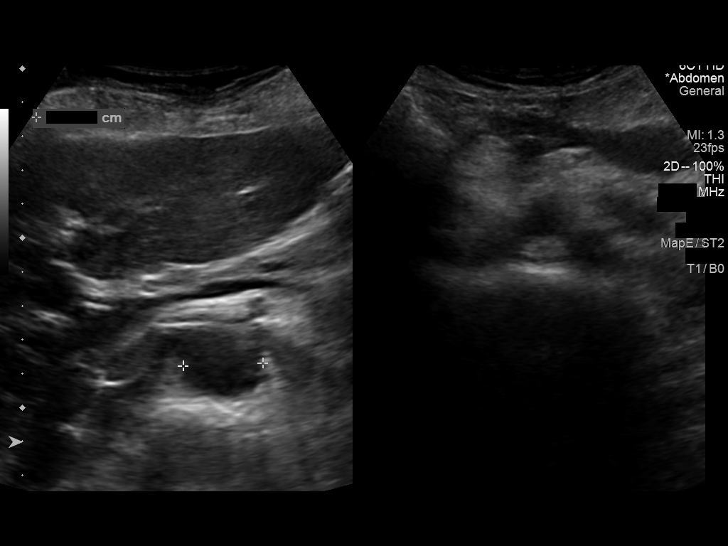

[14 of 25 positions shown; findings below may reference images not displayed]

FINDINGS: Gallbladder: No gallstones or wall thickening visualized. No
sonographic Murphy sign noted by sonographer.

Common bile duct: Diameter: 14 mm, previously 11 mm.

Liver: No focal lesion identified. Unchanged heterogeneously
increased parenchymal echogenicity. Portal vein is patent on color
Doppler imaging with normal direction of blood flow towards the
liver.

IVC: No abnormality visualized.

Pancreas: Visualized portion unremarkable.

Spleen: Size and appearance within normal limits.

Right Kidney: Length: 11.6 cm. Echogenicity within normal limits. No
mass or hydronephrosis visualized.

Left Kidney: Length: 12.2 cm. Echogenicity within normal limits. No
mass or hydronephrosis visualized.

Abdominal aorta: No aneurysm visualized.

Other findings: None.
IMPRESSION: 1. Unchanged liver disease without focal lesion.
2. Chronic, slightly worsened common bile duct dilatation. Correlate
with LFTs and consider MRCP for further evaluation.

## 2023-01-19 ENCOUNTER — Ambulatory Visit: Admit: 2023-01-19 | Discharge: 2023-01-19 | Payer: PRIVATE HEALTH INSURANCE

## 2023-01-24 DIAGNOSIS — K838 Other specified diseases of biliary tract: Principal | ICD-10-CM

## 2023-01-25 DIAGNOSIS — B182 Chronic viral hepatitis C: Principal | ICD-10-CM

## 2023-01-25 DIAGNOSIS — K838 Other specified diseases of biliary tract: Principal | ICD-10-CM

## 2023-02-28 ENCOUNTER — Ambulatory Visit: Admit: 2023-02-28 | Discharge: 2023-03-01 | Payer: PRIVATE HEALTH INSURANCE

## 2023-02-28 DIAGNOSIS — B182 Chronic viral hepatitis C: Principal | ICD-10-CM

## 2023-02-28 DIAGNOSIS — K838 Other specified diseases of biliary tract: Principal | ICD-10-CM

## 2023-09-22 ENCOUNTER — Ambulatory Visit: Admit: 2023-09-22 | Payer: PRIVATE HEALTH INSURANCE
